# Patient Record
Sex: Female | Born: 1975 | Race: Black or African American | Hispanic: No | Marital: Single | Smoking: Never smoker
Health system: Southern US, Community
[De-identification: ages and names within clinical notes are randomized; demographics above are authoritative.]

## PROBLEM LIST (undated history)

## (undated) DIAGNOSIS — R202 Paresthesia of skin: Principal | ICD-10-CM

## (undated) DIAGNOSIS — I10 Essential (primary) hypertension: Secondary | ICD-10-CM

## (undated) DIAGNOSIS — I82412 Acute embolism and thrombosis of left femoral vein: Secondary | ICD-10-CM

## (undated) DIAGNOSIS — D492 Neoplasm of unspecified behavior of bone, soft tissue, and skin: Secondary | ICD-10-CM

## (undated) DIAGNOSIS — R200 Anesthesia of skin: Secondary | ICD-10-CM

## (undated) DIAGNOSIS — N946 Dysmenorrhea, unspecified: Secondary | ICD-10-CM

## (undated) HISTORY — DX: Anesthesia of skin: R20.0

## (undated) HISTORY — DX: Acute embolism and thrombosis of left femoral vein: I82.412

## (undated) HISTORY — DX: Dysmenorrhea, unspecified: N94.6

## (undated) HISTORY — PX: MOUTH SURGERY: SHX715

## (undated) HISTORY — DX: Paresthesia of skin: R20.2

## (undated) HISTORY — PX: OTHER SURGICAL HISTORY: SHX169

## (undated) HISTORY — DX: Neoplasm of unspecified behavior of bone, soft tissue, and skin: D49.2

## (undated) HISTORY — DX: Essential (primary) hypertension: I10

---

## 1999-07-08 DIAGNOSIS — T8859XA Other complications of anesthesia, initial encounter: Secondary | ICD-10-CM

## 1999-07-08 HISTORY — DX: Other complications of anesthesia, initial encounter: T88.59XA

## 2002-05-08 ENCOUNTER — Emergency Department (HOSPITAL_COMMUNITY): Admission: EM | Admit: 2002-05-08 | Discharge: 2002-05-08 | Payer: Self-pay | Admitting: Emergency Medicine

## 2002-06-09 ENCOUNTER — Emergency Department (HOSPITAL_COMMUNITY): Admission: EM | Admit: 2002-06-09 | Discharge: 2002-06-09 | Payer: Self-pay

## 2003-08-24 ENCOUNTER — Other Ambulatory Visit: Admission: RE | Admit: 2003-08-24 | Discharge: 2003-08-24 | Payer: Self-pay | Admitting: Internal Medicine

## 2004-06-06 ENCOUNTER — Emergency Department (HOSPITAL_COMMUNITY): Admission: EM | Admit: 2004-06-06 | Discharge: 2004-06-06 | Payer: Self-pay | Admitting: Emergency Medicine

## 2004-09-09 ENCOUNTER — Emergency Department (HOSPITAL_COMMUNITY): Admission: EM | Admit: 2004-09-09 | Discharge: 2004-09-09 | Payer: Self-pay | Admitting: Emergency Medicine

## 2004-11-07 ENCOUNTER — Other Ambulatory Visit: Admission: RE | Admit: 2004-11-07 | Discharge: 2004-11-07 | Payer: Self-pay | Admitting: Internal Medicine

## 2005-11-13 ENCOUNTER — Other Ambulatory Visit: Admission: RE | Admit: 2005-11-13 | Discharge: 2005-11-13 | Payer: Self-pay | Admitting: Internal Medicine

## 2006-11-16 ENCOUNTER — Other Ambulatory Visit: Admission: RE | Admit: 2006-11-16 | Discharge: 2006-11-16 | Payer: Self-pay | Admitting: *Deleted

## 2007-11-23 ENCOUNTER — Other Ambulatory Visit: Admission: RE | Admit: 2007-11-23 | Discharge: 2007-11-23 | Payer: Self-pay | Admitting: Family Medicine

## 2008-11-29 ENCOUNTER — Other Ambulatory Visit: Admission: RE | Admit: 2008-11-29 | Discharge: 2008-11-29 | Payer: Self-pay | Admitting: Family Medicine

## 2009-08-22 ENCOUNTER — Emergency Department (HOSPITAL_COMMUNITY): Admission: EM | Admit: 2009-08-22 | Discharge: 2009-08-22 | Payer: Self-pay | Admitting: Emergency Medicine

## 2009-12-21 ENCOUNTER — Other Ambulatory Visit: Admission: RE | Admit: 2009-12-21 | Discharge: 2009-12-21 | Payer: Self-pay | Admitting: Family Medicine

## 2010-09-25 LAB — POCT CARDIAC MARKERS: CKMB, poc: <1 ng/mL — ABNORMAL LOW (ref 1.0–8.0)

## 2010-09-25 LAB — DIFFERENTIAL
Basophils Absolute: 0.1 10*3/uL (ref 0.0–0.1)
Eosinophils Absolute: 0.1 10*3/uL (ref 0.0–0.7)
Lymphocytes Relative: 49 % — ABNORMAL HIGH (ref 12–46)
Lymphs Abs: 2.3 10*3/uL (ref 0.7–4.0)
Monocytes Absolute: 0.5 10*3/uL (ref 0.1–1.0)
Monocytes Relative: 11 % (ref 3–12)
Neutro Abs: 1.7 10*3/uL (ref 1.7–7.7)

## 2010-09-25 LAB — COMPREHENSIVE METABOLIC PANEL
BUN: 14 mg/dL (ref 6–23)
Chloride: 110 mEq/L (ref 96–112)
Creatinine, Ser: 0.84 mg/dL (ref 0.4–1.2)
GFR calc Af Amer: >60 mL/min (ref >60)
Glucose, Bld: 117 mg/dL — ABNORMAL HIGH (ref 70–99)
Sodium: 141 mEq/L (ref 135–145)
Total Protein: 6.7 g/dL (ref 6.0–8.3)

## 2010-09-25 LAB — CBC
Hemoglobin: 11.6 g/dL — ABNORMAL LOW (ref 12.0–15.0)
Platelets: 167 10*3/uL (ref 150–400)
RBC: 3.88 MIL/uL (ref 3.87–5.11)

## 2010-09-25 LAB — APTT: aPTT: 23 seconds — ABNORMAL LOW (ref 24–37)

## 2010-12-24 ENCOUNTER — Other Ambulatory Visit: Payer: Self-pay | Admitting: Family Medicine

## 2010-12-24 ENCOUNTER — Other Ambulatory Visit (HOSPITAL_COMMUNITY)
Admission: RE | Admit: 2010-12-24 | Discharge: 2010-12-24 | Disposition: A | Discharge status: Home / Self Care | Payer: BC Managed Care – PPO | Source: Ambulatory Visit | Attending: Family Medicine | Admitting: Family Medicine

## 2010-12-24 DIAGNOSIS — R87810 Cervical high risk human papillomavirus (HPV) DNA test positive: Secondary | ICD-10-CM | POA: Insufficient documentation

## 2010-12-24 DIAGNOSIS — Z124 Encounter for screening for malignant neoplasm of cervix: Secondary | ICD-10-CM | POA: Insufficient documentation

## 2011-02-06 ENCOUNTER — Encounter (INDEPENDENT_AMBULATORY_CARE_PROVIDER_SITE_OTHER): Payer: Self-pay | Admitting: General Surgery

## 2011-12-26 ENCOUNTER — Other Ambulatory Visit: Payer: Self-pay | Admitting: Family Medicine

## 2011-12-26 ENCOUNTER — Other Ambulatory Visit (HOSPITAL_COMMUNITY)
Admission: RE | Admit: 2011-12-26 | Discharge: 2011-12-26 | Disposition: A | Discharge status: Home / Self Care | Payer: BC Managed Care – PPO | Source: Ambulatory Visit | Attending: Family Medicine | Admitting: Family Medicine

## 2011-12-26 DIAGNOSIS — Z124 Encounter for screening for malignant neoplasm of cervix: Secondary | ICD-10-CM | POA: Insufficient documentation

## 2011-12-26 DIAGNOSIS — N760 Acute vaginitis: Secondary | ICD-10-CM | POA: Insufficient documentation

## 2011-12-26 DIAGNOSIS — Z1151 Encounter for screening for human papillomavirus (HPV): Secondary | ICD-10-CM | POA: Insufficient documentation

## 2012-01-12 ENCOUNTER — Other Ambulatory Visit: Payer: Self-pay | Admitting: Family Medicine

## 2012-07-16 ENCOUNTER — Other Ambulatory Visit: Payer: Self-pay | Admitting: Family Medicine

## 2012-07-16 ENCOUNTER — Other Ambulatory Visit (HOSPITAL_COMMUNITY)
Admission: RE | Admit: 2012-07-16 | Discharge: 2012-07-16 | Disposition: A | Discharge status: Home / Self Care | Payer: BC Managed Care – PPO | Source: Ambulatory Visit | Attending: Family Medicine | Admitting: Family Medicine

## 2012-07-16 DIAGNOSIS — Z01419 Encounter for gynecological examination (general) (routine) without abnormal findings: Secondary | ICD-10-CM | POA: Insufficient documentation

## 2012-07-16 DIAGNOSIS — Z1151 Encounter for screening for human papillomavirus (HPV): Secondary | ICD-10-CM | POA: Insufficient documentation

## 2012-12-29 ENCOUNTER — Other Ambulatory Visit (HOSPITAL_COMMUNITY)
Admission: RE | Admit: 2012-12-29 | Discharge: 2012-12-29 | Disposition: A | Discharge status: Home / Self Care | Payer: BC Managed Care – PPO | Source: Ambulatory Visit | Attending: Family Medicine | Admitting: Family Medicine

## 2012-12-29 ENCOUNTER — Other Ambulatory Visit: Payer: Self-pay | Admitting: Family Medicine

## 2012-12-29 DIAGNOSIS — Z124 Encounter for screening for malignant neoplasm of cervix: Secondary | ICD-10-CM | POA: Insufficient documentation

## 2012-12-29 DIAGNOSIS — Z1151 Encounter for screening for human papillomavirus (HPV): Secondary | ICD-10-CM | POA: Insufficient documentation

## 2013-08-02 ENCOUNTER — Encounter: Payer: Self-pay | Admitting: Neurology

## 2013-08-02 ENCOUNTER — Ambulatory Visit (INDEPENDENT_AMBULATORY_CARE_PROVIDER_SITE_OTHER): Payer: BC Managed Care – PPO | Admitting: Neurology

## 2013-08-02 ENCOUNTER — Encounter (INDEPENDENT_AMBULATORY_CARE_PROVIDER_SITE_OTHER): Payer: Self-pay

## 2013-08-02 ENCOUNTER — Telehealth: Payer: Self-pay | Admitting: Neurology

## 2013-08-02 VITALS — BP 98/55 | HR 64 | Ht 67.0 in | Wt 156.0 lb

## 2013-08-02 DIAGNOSIS — N946 Dysmenorrhea, unspecified: Secondary | ICD-10-CM | POA: Insufficient documentation

## 2013-08-02 DIAGNOSIS — R202 Paresthesia of skin: Principal | ICD-10-CM | POA: Insufficient documentation

## 2013-08-02 DIAGNOSIS — R200 Anesthesia of skin: Secondary | ICD-10-CM | POA: Insufficient documentation

## 2013-08-02 DIAGNOSIS — R209 Unspecified disturbances of skin sensation: Secondary | ICD-10-CM

## 2013-08-02 NOTE — Telephone Encounter (Signed)
  I have called her, dicussed the need for MRI cervical scan.

## 2013-08-02 NOTE — Progress Notes (Signed)
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 Referred by her primary care physician Dr. Orland Penman, for evaluation of right hand numbness. Last visit with me was 2011.  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  she presenting with acute onset of left hand numbness involving dorsum left second and first fingers, weakness of left arm and hand on Feb 16th 2011.  She woke up from sleep about 2:00 in the morning,noticed numbness involving her left hand,she also noticed clumsiness of her left  hand, has difficulty extending her left wrist and finger.  she actually went to the emergency room. MRI brain was normal. There is no evidence of acute stroke.  But her symptoms persistent  she complained persistent left hand numbness, tingling involving dorsum left first and second fingers, weakness mostly left wrist extension, and finger extension, she also has weakness such as typing, holding books.  She had gradual improvement over few weeks.    she denies involvement of her left face, left trunk, left leg. there is no similar episode in the past.    she has been on contraceptive treatment for prolonged time, no othher vascular risk factors.   EMG nerve conduction study of left upper extremity today is normal, there is no evidence of left upper extremity neuropathy, such as radial neuropathy,or left cervical radiculopathy.  She has been doing very well on to a December 20 third 2014, she had the habit of sleeping with her arm underneath her head, she woke up began to notice right hand numbness, she has to continue to shake her hand is to about one hour for right hand numbness to go away, but she continued to have persistent right thumb and index finger numbness, mainly involving the back part of her fingers, and hands, no significant  weakness this time,  She denies neck pain, no gait difficulty. She also complains sometimes she woke up from sleep, has difficulty extending her arms, suggestive of triceps weakness    Her mother at [place]  underwent lumbar decompression surgery, prior to the surgery she had left foot drop for 2 years, surgery failed to help her symptoms there was no family history of similar disease,   REVIEW OF SYSTEMS: Full 14 system review of systems performed and notable only for numbness, weakness   ALLERGIES: Allergies  Allergen Reactions  . Codeine     HOME MEDICATIONS: No outpatient prescriptions prior to visit.   No facility-administered medications prior to visit.    PAST MEDICAL HISTORY: Past Medical History  Diagnosis Date  . Numbness and tingling in right hand   . Dysmenorrhea     PAST SURGICAL HISTORY: Past Surgical History  Procedure Laterality Date  . None      FAMILY HISTORY: History reviewed. No pertinent family history.  SOCIAL HISTORY:  History   Social History  . Marital Status: Single    Spouse Name: N/A    Number of Children: N/A  . Years of Education: College   Occupational History  .      School Teacher   Social History Main Topics  . Smoking status: Never Smoker   . Smokeless tobacco: Never Used  . Alcohol Use: No  . Drug Use: No  . Sexual Activity: Not on file   Other Topics Concern  . Not on file   Social History Narrative  Patient is a Education officer, museum full time.   EducationNurse, mental health.   Right handed.              PHYSICAL EXAM   Filed Vitals:   08/02/13 1509  BP: 98/55  Pulse: 64  Height: _0  (1.702 m)  Weight: 156 lb (70.761 kg)    Not recorded    Body mass index is 24.43 kg/(m^2).   Generalized: In no acute distress  Neck: Supple, no carotid bruits   Cardiac: Regular rate rhythm  Pulmonary: Clear to auscultation bilaterally  Musculoskeletal: No deformity  Neurological examination  Mentation: Alert  oriented to time, place, history taking, and causual conversation  Cranial nerve II-XII: Pupils were equal round reactive to light extraocular movements were full, Visual field were full on confrontational test. Bilateral fundi were sharp.  Facial sensation and strength were normal. Hearing was intact to finger rubbing bilaterally. Uvula tongue midline.  head turning and shoulder shrug and were normal and symmetric.Tongue protrusion into cheek strength was normal.  Motor: normal tone, bulk and strength.  Sensory: Intact to fine touch, pinprick, preserved vibratory sensation, and proprioception at toes, with exception of decreased light touch, and hypersensitivity at right dorsum thumb index finger, in the distribution of left superficial radial nerve.  Coordination: Normal finger to nose, heel-to-shin bilaterally there was no truncal ataxia  Gait: Rising up from seated position without assistance, normal stance, without trunk ataxia, moderate stride, good arm swing, smooth turning, able to perform tiptoe, and heel walking without difficulty.   Romberg signs: Negative  Deep tendon reflexes: Brachioradialis 2/2, biceps 2/2, triceps 2/2, patellar 2/2, Achilles 2/2, plantar responses were flexor bilaterally.   DIAGNOSTIC DATA (LABS, IMAGING, TESTING) - I reviewed patient records, labs, notes, testing and imaging myself where available.  Lab Results  Component Value Date   WBC 4.6 08/22/2009   HGB 11.6* 08/22/2009   HCT 33.8* 08/22/2009   MCV 87.3 08/22/2009   PLT 167 08/22/2009      Component Value Date/Time   NA 141 08/22/2009 0454   K 4.4 08/22/2009 0454   CL 110 08/22/2009 0454   CO2 25 08/22/2009 0454   GLUCOSE 117* 08/22/2009 0454   BUN 14 08/22/2009 0454   CREATININE 0.84 08/22/2009 0454   CALCIUM 8.8 08/22/2009 0454   PROT 6.7 08/22/2009 0454   ALBUMIN 3.4* 08/22/2009 0454   AST 18 08/22/2009 0454   ALT 13 08/22/2009 0454   ALKPHOS 40 08/22/2009 0454   BILITOT 0.5 08/22/2009 0454    GFRNONAA >60 08/22/2009 0454   GFRAA  Value: >60        The eGFR has been calculated using the MDRD equation. This calculation has not been validated in all clinical situations. eGFR's persistently <60 mL/min signify possible Chronic Kidney Disease. 08/22/2009 0454   ASSESSMENT AND PLAN   38 year old female,with acute onset of right hand numbness in the distribution of right superficial radial nerve, previously similar presentation involving left radial nerve   1 differentiation diagnosis including hereditary neuropathy with liability to pressure  2 Will repeat EMG nerve conduction study 3 MRI of cervical spine 4 [facility] genetic testing.   Marcial Pacas, M.D. Ph.D.  [facility] [address] [place], [place] [address] [phone]

## 2013-08-03 NOTE — Progress Notes (Signed)
Quick Note:  Please call patient, elevated TSH indicating elevated thyroid functional test, will fax the result to her primary care, she needs repeat thyroid functional panel in 3-4 months, ______

## 2013-08-04 LAB — LYME, TOTAL AB TEST/REFLEX: Lyme IgG/IgM Ab: <0.91 {ISR} (ref 0.00–0.90)

## 2013-08-04 LAB — COMPREHENSIVE METABOLIC PANEL
ALK PHOS: 34 IU/L — AB (ref 39–117)
ALT: 16 IU/L (ref 0–32)
AST: 19 IU/L (ref 0–40)
Albumin/Globulin Ratio: 1.7 (ref 1.1–2.5)
Albumin: 3.8 g/dL (ref 3.5–5.5)
BUN/Creatinine Ratio: 21 — ABNORMAL HIGH (ref 8–20)
BUN: 17 mg/dL (ref 6–20)
CHLORIDE: 105 mmol/L (ref 97–108)
CO2: 23 mmol/L (ref 18–29)
CREATININE: 0.82 mg/dL (ref 0.57–1.00)
Calcium: 8.7 mg/dL (ref 8.7–10.2)
GFR, EST AFRICAN AMERICAN: 104 mL/min/{1.73_m2} (ref >59)
GFR, EST NON AFRICAN AMERICAN: 90 mL/min/{1.73_m2} (ref >59)
Globulin, Total: 2.2 g/dL (ref 1.5–4.5)
Glucose: 82 mg/dL (ref 65–99)
POTASSIUM: 4.2 mmol/L (ref 3.5–5.2)
SODIUM: 140 mmol/L (ref 134–144)
Total Bilirubin: 0.2 mg/dL (ref 0.0–1.2)
Total Protein: 6 g/dL (ref 6.0–8.5)

## 2013-08-04 LAB — HIV ANTIBODY (ROUTINE TESTING W REFLEX)
HIV 1/HIV 2 AB: NONREACTIVE
HIV 1/O/2 Abs-Index Value: <1 (ref <1.00)

## 2013-08-04 LAB — IFE AND PE, SERUM
Albumin SerPl Elph-Mcnc: 3.3 g/dL (ref 3.2–5.6)
Albumin/Glob SerPl: 1.3 (ref 0.7–2.0)
Alpha 1: 0.2 g/dL (ref 0.1–0.4)
Alpha2 Glob SerPl Elph-Mcnc: 0.6 g/dL (ref 0.4–1.2)
B-GLOBULIN SERPL ELPH-MCNC: 0.8 g/dL (ref 0.6–1.3)
Gamma Glob SerPl Elph-Mcnc: 1.1 g/dL (ref 0.5–1.6)
Globulin, Total: 2.7 g/dL (ref 2.0–4.5)
IGA/IMMUNOGLOBULIN A, SERUM: 229 mg/dL (ref 91–414)
IGG (IMMUNOGLOBIN G), SERUM: 1188 mg/dL (ref 700–1600)
IGM (IMMUNOGLOBULIN M), SRM: 90 mg/dL (ref 40–230)

## 2013-08-04 LAB — CBC WITH DIFFERENTIAL
BASOS: 0 %
Basophils Absolute: 0 10*3/uL (ref 0.0–0.2)
EOS ABS: 0 10*3/uL (ref 0.0–0.4)
Eos: 1 %
HEMATOCRIT: 35.1 % (ref 34.0–46.6)
Hemoglobin: 11.4 g/dL (ref 11.1–15.9)
IMMATURE GRANULOCYTES: 0 %
Immature Grans (Abs): 0 10*3/uL (ref 0.0–0.1)
LYMPHS ABS: 2.1 10*3/uL (ref 0.7–3.1)
LYMPHS: 40 %
MCH: 28.2 pg (ref 26.6–33.0)
MCHC: 32.5 g/dL (ref 31.5–35.7)
MCV: 87 fL (ref 79–97)
MONOCYTES: 11 %
MONOS ABS: 0.6 10*3/uL (ref 0.1–0.9)
NEUTROS ABS: 2.4 10*3/uL (ref 1.4–7.0)
NEUTROS PCT: 48 %
Platelets: 227 10*3/uL (ref 150–379)
RBC: 4.04 x10E6/uL (ref 3.77–5.28)
RDW: 14.1 % (ref 12.3–15.4)
WBC: 5.1 10*3/uL (ref 3.4–10.8)

## 2013-08-04 LAB — CK: CK TOTAL: 102 U/L (ref 24–173)

## 2013-08-04 LAB — THYROID PANEL WITH TSH
FREE THYROXINE INDEX: 2.2 (ref 1.2–4.9)
T3 UPTAKE RATIO: 29 % (ref 24–39)
T4 TOTAL: 7.6 ug/dL (ref 4.5–12.0)
TSH: 0.377 u[IU]/mL — ABNORMAL LOW (ref 0.450–4.500)

## 2013-08-04 LAB — VITAMIN B12: Vitamin B-12: 625 pg/mL (ref 211–946)

## 2013-08-04 LAB — C-REACTIVE PROTEIN: CRP: 0.7 mg/L (ref 0.0–4.9)

## 2013-08-04 LAB — FOLATE: Folate: >19.9 ng/mL (ref >3.0)

## 2013-08-04 LAB — SEDIMENTATION RATE: SED RATE: 3 mm/h (ref 0–32)

## 2013-08-04 LAB — RPR: RPR: NONREACTIVE

## 2013-08-05 ENCOUNTER — Telehealth: Payer: Self-pay | Admitting: *Deleted

## 2013-08-05 NOTE — Telephone Encounter (Signed)
I called pt and LMVM for her to return call about [facility] [facility] testing that Dr. Krista Blue ordered.

## 2013-08-08 ENCOUNTER — Ambulatory Visit (INDEPENDENT_AMBULATORY_CARE_PROVIDER_SITE_OTHER): Payer: BC Managed Care – PPO | Admitting: Neurology

## 2013-08-08 ENCOUNTER — Encounter (INDEPENDENT_AMBULATORY_CARE_PROVIDER_SITE_OTHER): Payer: Self-pay

## 2013-08-08 DIAGNOSIS — Z0289 Encounter for other administrative examinations: Secondary | ICD-10-CM

## 2013-08-08 DIAGNOSIS — R202 Paresthesia of skin: Principal | ICD-10-CM

## 2013-08-08 DIAGNOSIS — G5630 Lesion of radial nerve, unspecified upper limb: Secondary | ICD-10-CM

## 2013-08-08 DIAGNOSIS — R200 Anesthesia of skin: Secondary | ICD-10-CM

## 2013-08-08 DIAGNOSIS — R209 Unspecified disturbances of skin sensation: Secondary | ICD-10-CM

## 2013-08-08 NOTE — Procedures (Signed)
   NCS (NERVE CONDUCTION STUDY) WITH EMG (ELECTROMYOGRAPHY) REPORT   STUDY DATE: February second 2015 PATIENT NAME: Kristine Kirby DOB: 08/19/1975 MRN: [id number]    TECHNOLOGIST: Laretta Alstrom ELECTROMYOGRAPHER: Marcial Pacas M.D.  CLINICAL INFORMATION:   38 years old [place] American female, with persistent right dorsum hand numbness after she slept on it, there was no significant weakness  On examination: Bilateral hand wrist flexion extension finger flexion extension motor strength is normal. deep tendon reflexes were normal and symmetric  FINDINGS: NERVE CONDUCTION STUDY: Right median, ulnar, and radial sensory responses were normal. Right median, ulnar, and radial motor responses were normal  NEEDLE ELECTROMYOGRAPHY:  Selected needle examination was performed at right upper extremity muscles, right cervical paraspinal muscles  Needle examination of right pronator teres, first also interossei, triceps, biceps, deltoid, brachioradialis was normal  There was no spontaneous activity at right cervical paraspinal muscles, right C5, 6, 7  IMPRESSION:   This is a normal study. There is no electrodiagnostic evidence of right upper extremity neuropathy, or right cervical radiculopathy   INTERPRETING PHYSICIAN:   Marcial Pacas M.D. Ph.D. [facility] [address] [place], [place] [address] [phone]

## 2013-08-12 NOTE — Progress Notes (Signed)
Quick Note:  Left message with elevated TSH, per Dr. Krista Blue. Relayed we will forward result to PCP, and to call with further questions. ______

## 2013-08-15 NOTE — Telephone Encounter (Signed)
I called pt and asked her to call me back about the specialty labs requested by Dr.Yan via VM.    This was my second call.   Pt will call back if interested in this.

## 2013-08-29 ENCOUNTER — Ambulatory Visit
Admission: RE | Admit: 2013-08-29 | Discharge: 2013-08-29 | Disposition: A | Discharge status: Home / Self Care | Payer: BC Managed Care – PPO | Source: Ambulatory Visit | Attending: Neurology | Admitting: Neurology

## 2013-08-29 DIAGNOSIS — R200 Anesthesia of skin: Secondary | ICD-10-CM

## 2013-08-29 DIAGNOSIS — R202 Paresthesia of skin: Principal | ICD-10-CM

## 2013-08-29 DIAGNOSIS — R209 Unspecified disturbances of skin sensation: Secondary | ICD-10-CM

## 2013-09-02 NOTE — Progress Notes (Signed)
Quick Note:  Shared normal MR Cervical spine thru VM message ______

## 2013-09-08 ENCOUNTER — Telehealth: Payer: Self-pay | Admitting: *Deleted

## 2013-09-08 NOTE — Telephone Encounter (Signed)
Message copied by Oliver Hum on Thu Sep 08, 2013  3:43 PM ------      Message from: Richmond Campbell      Created: Thu Sep 08, 2013  3:15 PM       Patient called and wanted to let you know that she has gotten all the messages you have left.  Did not ask for a returned call.  ------

## 2013-09-08 NOTE — Telephone Encounter (Signed)
Noted  

## 2013-11-10 ENCOUNTER — Ambulatory Visit: Payer: BC Managed Care – PPO | Admitting: Neurology

## 2013-12-26 ENCOUNTER — Ambulatory Visit: Payer: BC Managed Care – PPO | Admitting: Neurology

## 2013-12-26 ENCOUNTER — Ambulatory Visit: Payer: Self-pay | Admitting: Neurology

## 2013-12-30 ENCOUNTER — Other Ambulatory Visit (HOSPITAL_COMMUNITY)
Admission: RE | Admit: 2013-12-30 | Discharge: 2013-12-30 | Disposition: A | Discharge status: Home / Self Care | Payer: BC Managed Care – PPO | Source: Ambulatory Visit | Attending: Family Medicine | Admitting: Family Medicine

## 2013-12-30 ENCOUNTER — Other Ambulatory Visit: Payer: Self-pay | Admitting: Family Medicine

## 2013-12-30 DIAGNOSIS — Z1151 Encounter for screening for human papillomavirus (HPV): Secondary | ICD-10-CM | POA: Insufficient documentation

## 2013-12-30 DIAGNOSIS — Z124 Encounter for screening for malignant neoplasm of cervix: Secondary | ICD-10-CM | POA: Insufficient documentation

## 2014-01-02 LAB — CYTOLOGY - PAP

## 2015-06-02 ENCOUNTER — Emergency Department (EMERGENCY_DEPARTMENT_HOSPITAL)
Admit: 2015-06-02 | Discharge: 2015-06-02 | Disposition: A | Discharge status: Home / Self Care | Payer: BC Managed Care – PPO | Attending: Emergency Medicine | Admitting: Emergency Medicine

## 2015-06-02 ENCOUNTER — Encounter (HOSPITAL_COMMUNITY): Payer: Self-pay | Admitting: Emergency Medicine

## 2015-06-02 ENCOUNTER — Emergency Department (HOSPITAL_COMMUNITY)
Admission: EM | Admit: 2015-06-02 | Discharge: 2015-06-02 | Disposition: A | Discharge status: Home / Self Care | Payer: BC Managed Care – PPO | Attending: Emergency Medicine | Admitting: Emergency Medicine

## 2015-06-02 DIAGNOSIS — R2243 Localized swelling, mass and lump, lower limb, bilateral: Secondary | ICD-10-CM | POA: Diagnosis present

## 2015-06-02 DIAGNOSIS — M7989 Other specified soft tissue disorders: Secondary | ICD-10-CM | POA: Diagnosis not present

## 2015-06-02 DIAGNOSIS — Z8742 Personal history of other diseases of the female genital tract: Secondary | ICD-10-CM | POA: Insufficient documentation

## 2015-06-02 DIAGNOSIS — M545 Low back pain, unspecified: Secondary | ICD-10-CM

## 2015-06-02 DIAGNOSIS — Z793 Long term (current) use of hormonal contraceptives: Secondary | ICD-10-CM | POA: Insufficient documentation

## 2015-06-02 DIAGNOSIS — Z3202 Encounter for pregnancy test, result negative: Secondary | ICD-10-CM | POA: Diagnosis not present

## 2015-06-02 DIAGNOSIS — R600 Localized edema: Secondary | ICD-10-CM | POA: Insufficient documentation

## 2015-06-02 LAB — CBC WITH DIFFERENTIAL/PLATELET
BASOS PCT: 0 %
Basophils Absolute: 0 10*3/uL (ref 0.0–0.1)
Eosinophils Absolute: 0.1 10*3/uL (ref 0.0–0.7)
Eosinophils Relative: 2 %
HEMATOCRIT: 34.7 % — AB (ref 36.0–46.0)
HEMOGLOBIN: 11.4 g/dL — AB (ref 12.0–15.0)
Lymphocytes Relative: 50 %
Lymphs Abs: 2.4 10*3/uL (ref 0.7–4.0)
MCH: 28.6 pg (ref 26.0–34.0)
MCHC: 32.9 g/dL (ref 30.0–36.0)
MCV: 87.2 fL (ref 78.0–100.0)
MONOS PCT: 9 %
Monocytes Absolute: 0.4 10*3/uL (ref 0.1–1.0)
NEUTROS ABS: 1.9 10*3/uL (ref 1.7–7.7)
NEUTROS PCT: 39 %
Platelets: 182 10*3/uL (ref 150–400)
RBC: 3.98 MIL/uL (ref 3.87–5.11)
RDW: 13.9 % (ref 11.5–15.5)
WBC: 4.9 10*3/uL (ref 4.0–10.5)

## 2015-06-02 LAB — URINALYSIS, ROUTINE W REFLEX MICROSCOPIC
Bilirubin Urine: NEGATIVE
Glucose, UA: NEGATIVE mg/dL
HGB URINE DIPSTICK: NEGATIVE
Ketones, ur: NEGATIVE mg/dL
LEUKOCYTES UA: NEGATIVE
NITRITE: NEGATIVE
Protein, ur: NEGATIVE mg/dL
SPECIFIC GRAVITY, URINE: 1.012 (ref 1.005–1.030)
pH: 7.5 (ref 5.0–8.0)

## 2015-06-02 LAB — PREGNANCY, URINE: PREG TEST UR: NEGATIVE

## 2015-06-02 LAB — COMPREHENSIVE METABOLIC PANEL
ALT: 17 U/L (ref 14–54)
ANION GAP: 8 (ref 5–15)
AST: 18 U/L (ref 15–41)
Albumin: 3.4 g/dL — ABNORMAL LOW (ref 3.5–5.0)
Alkaline Phosphatase: 42 U/L (ref 38–126)
BUN: 12 mg/dL (ref 6–20)
CALCIUM: 8.7 mg/dL — AB (ref 8.9–10.3)
CHLORIDE: 106 mmol/L (ref 101–111)
CO2: 24 mmol/L (ref 22–32)
Creatinine, Ser: 0.83 mg/dL (ref 0.44–1.00)
GFR calc non Af Amer: >60 mL/min (ref >60)
Glucose, Bld: 97 mg/dL (ref 65–99)
Potassium: 4.7 mmol/L (ref 3.5–5.1)
SODIUM: 138 mmol/L (ref 135–145)
Total Bilirubin: 0.4 mg/dL (ref 0.3–1.2)
Total Protein: 6.5 g/dL (ref 6.5–8.1)

## 2015-06-02 LAB — BRAIN NATRIURETIC PEPTIDE: B Natriuretic Peptide: 172.1 pg/mL — ABNORMAL HIGH (ref 0.0–100.0)

## 2015-06-02 NOTE — Progress Notes (Signed)
VASCULAR LAB PRELIMINARY  PRELIMINARY  PRELIMINARY  PRELIMINARY  Bilateral lower extremity venous duplex completed.    Preliminary report:  There is no DVT or SVT noted in the bilateral lower extremities.   Caitriona Sundquist, RVT 06/02/2015, 1:30 PM

## 2015-06-02 NOTE — ED Provider Notes (Signed)
CSN: [id number]     Arrival date & time 06/02/15  0915 History   First MD Initiated Contact with Patient 06/02/15 0957     Chief Complaint  Patient presents with  . Leg Swelling    bilateral, left > right     (Consider location/radiation/quality/duration/timing/severity/associated sxs/prior Treatment) HPI Comments: 39 year old female who presents with leg swelling. Patient states that yesterday she noticed bilateral leg swelling which is worse in her left leg. She noticed some warmth of her left leg and tightness. No significant pain. No recent trauma. She also notes some low back pain across her lower back. Pain is worse with certain movements. No saddle anesthesia, extremity weakness, or numbness. No urinary problems. No abdominal pain, vomiting, or fevers. No history of blood clots or cancer. No recent travel. Patient is on OCPs.  The history is provided by the patient.    Past Medical History  Diagnosis Date  . Numbness and tingling in right hand   . Dysmenorrhea    Past Surgical History  Procedure Laterality Date  . None     No family history on file. Social History  Substance Use Topics  . Smoking status: Never Smoker   . Smokeless tobacco: Never Used  . Alcohol Use: No     Comment: occ   OB History    No data available     Review of Systems 10 Systems reviewed and are negative for acute change except as noted in the HPI.    Allergies  Codeine  Home Medications   Prior to Admission medications   Medication Sig Start Date End Date Taking? Authorizing Provider  VELIVET 0.1/0.125/0.15 -0.025 MG tablet Take 1 tablet by mouth every morning.  06/03/13  Yes Historical Provider, MD   BP 166/97 mmHg  Pulse 66  Temp(Src) 98 F (36.7 C) (Oral)  Resp 18  SpO2 100%  LMP 05/19/2015 (Approximate) Physical Exam  Constitutional: She is oriented to person, place, and time. She appears well-developed and well-nourished. No distress.  HENT:  Head: Normocephalic and  atraumatic.  Moist mucous membranes  Eyes: Conjunctivae are normal. Pupils are equal, round, and reactive to light.  Neck: Neck supple.  Cardiovascular: Normal rate, regular rhythm and normal heart sounds.   No murmur heard. Pulmonary/Chest: Effort normal and breath sounds normal.  Abdominal: Soft. Bowel sounds are normal. She exhibits no distension. There is no tenderness.  Musculoskeletal:  2+ pitting edema LLE, 1+ pitting edema RLE; 2+ pedal pulses; 5/5 strength and normal sensation b/l LE; no midline spinal tenderness.  Neurological: She is alert and oriented to person, place, and time.  Fluent speech  Skin: Skin is warm and dry. No rash noted. No erythema.  Psychiatric: She has a normal mood and affect. Judgment normal.  Nursing note and vitals reviewed.   ED Course  Procedures (including critical care time) Labs Review Labs Reviewed  COMPREHENSIVE METABOLIC PANEL - Abnormal; Notable for the following:    Calcium 8.7 (*)    Albumin 3.4 (*)    All other components within normal limits  CBC WITH DIFFERENTIAL/PLATELET - Abnormal; Notable for the following:    Hemoglobin 11.4 (*)    HCT 34.7 (*)    All other components within normal limits  BRAIN NATRIURETIC PEPTIDE - Abnormal; Notable for the following:    B Natriuretic Peptide 172.1 (*)    All other components within normal limits  URINALYSIS, ROUTINE W REFLEX MICROSCOPIC (NOT AT [facility])  PREGNANCY, URINE    Imaging Review No  results found. I have personally reviewed and evaluated these lab results as part of my medical decision-making.   EKG Interpretation None      MDM   Final diagnoses:  None  b/l LE edema Low back strain   Pt p/w 1 day of LE swelling L>R as well as low back pain across lower back. On exam, patient well-appearing. Vital signs notable for hypertension. She had pitting edema of both legs left greater than right. Normal strength and sensation. No neurologic complaints to suggest cauda equina.  Obtained above labs to evaluate edema and also obtained bilateral lower extremity Dopplers as patient is on OCPs.  Ultrasound was negative for DVT. Labwork unremarkable and shows normal WBC count, negative UA, and no evidence of heart failure, liver disease, or kidney disease to explain the patient's symptoms. Discussed reassuring workup with patient as well as supportive care instructions including compression stockings, elevation of legs, and avoidance of salt. Regarding back pain, Patient demonstrates no lower extremity weakness, saddle anesthesia, bowel or bladder incontinence, or any other neurologic deficits concerning for cauda equina. No fevers or other infectious symptoms to suggest by the patient's back pain is due to an infection. I have reviewed return precautions, including the development of any of these signs or symptoms, and the patient has voiced understanding. I reviewed supportive care instructions, including NSAIDs, early range of motion exercises, and PCP follow-up if symptoms do not improve for referral to physical therapy. Patient voiced understanding and was discharged in satisfactory condition.   Sharlett Iles, MD 06/02/15 [phone]

## 2015-06-02 NOTE — ED Notes (Signed)
Sent tests to lab

## 2015-06-02 NOTE — ED Notes (Signed)
Pt informed we need urine sample

## 2015-06-02 NOTE — ED Notes (Addendum)
Patient states her legs are swollen, left >right after standing up all day thursday. Patient also c/o back pain that started last night. Patient denies any similar problems of this nature. 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 Patient denies symptom improvement with elevation.

## 2015-10-01 ENCOUNTER — Emergency Department (HOSPITAL_COMMUNITY)
Admission: EM | Admit: 2015-10-01 | Discharge: 2015-10-01 | Disposition: A | Discharge status: Home / Self Care | Payer: BC Managed Care – PPO | Attending: Emergency Medicine | Admitting: Emergency Medicine

## 2015-10-01 ENCOUNTER — Encounter (HOSPITAL_COMMUNITY): Payer: Self-pay | Admitting: Emergency Medicine

## 2015-10-01 DIAGNOSIS — R110 Nausea: Secondary | ICD-10-CM | POA: Insufficient documentation

## 2015-10-01 DIAGNOSIS — Z8742 Personal history of other diseases of the female genital tract: Secondary | ICD-10-CM | POA: Diagnosis not present

## 2015-10-01 DIAGNOSIS — R61 Generalized hyperhidrosis: Secondary | ICD-10-CM | POA: Insufficient documentation

## 2015-10-01 DIAGNOSIS — Z79899 Other long term (current) drug therapy: Secondary | ICD-10-CM | POA: Diagnosis not present

## 2015-10-01 DIAGNOSIS — J020 Streptococcal pharyngitis: Secondary | ICD-10-CM | POA: Insufficient documentation

## 2015-10-01 DIAGNOSIS — J029 Acute pharyngitis, unspecified: Secondary | ICD-10-CM | POA: Diagnosis present

## 2015-10-01 LAB — RAPID STREP SCREEN (MED CTR MEBANE ONLY): Streptococcus, Group A Screen (Direct): POSITIVE — AB

## 2015-10-01 LAB — POC URINE PREG, ED: PREG TEST UR: NEGATIVE

## 2015-10-01 MED ORDER — IBUPROFEN 800 MG PO TABS: 800.0000 mg | ORAL_TABLET | Freq: Three times a day (TID) | ORAL | Status: DC | PRN

## 2015-10-01 MED ORDER — PENICILLIN G BENZATHINE 1200000 UNIT/2ML IM SUSP
1.2000 10*6.[IU] | Freq: Once | INTRAMUSCULAR | Status: AC
  Administered 2015-10-01: 1.2 10*6.[IU] via INTRAMUSCULAR
  Filled 2015-10-01: qty 2

## 2015-10-01 MED ORDER — CHLORHEXIDINE GLUCONATE 0.12 % MT SOLN: 15.0000 mL | Freq: Two times a day (BID) | OROMUCOSAL | Status: DC

## 2015-10-01 NOTE — ED Provider Notes (Signed)
CSN: [id number]     Arrival date & time 10/01/15  0108 History   First MD Initiated Contact with Patient 10/01/15 0222     Chief Complaint  Patient presents with  . Sore Throat     (Consider location/radiation/quality/duration/timing/severity/associated sxs/prior Treatment) HPI   40 year old female presenting with complaints of sore throat. For the past 2 days she has had body aches, chills, sweating, sore throat, nausea, and decrease in appetite. 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 She is a Pharmacist, hospital.  She is sleeping mostly, and complaining of myalgias. Furthermore, patient mentioned that she has noticed some mild vaginal spotting. She is usually regular and her anticipate menstrual period isn't into 5 days later. She denies having fever, chest pain, shortness of breath, productive cough, dysuria or vaginal discharge.  Past Medical History  Diagnosis Date  . Numbness and tingling in right hand   . Dysmenorrhea    Past Surgical History  Procedure Laterality Date  . None     No family history on file. Social History  Substance Use Topics  . Smoking status: Never Smoker   . Smokeless tobacco: Never Used  . Alcohol Use: No     Comment: occ   OB History    No data available     Review of Systems  All other systems reviewed and are negative.     Allergies  Codeine  Home Medications   Prior to Admission medications   Medication Sig Start Date End Date Taking? Authorizing Provider  VELIVET 0.1/0.125/0.15 -0.025 MG tablet Take 1 tablet by mouth every morning.  06/03/13   Historical Provider, MD   BP 131/52 mmHg  Pulse 72  Temp(Src) 98.8 F (37.1 C) (Oral)  Resp 18  SpO2 97%  LMP 10/01/2015 Physical Exam  Constitutional: She is oriented to person, place, and time. She appears well-developed and well-nourished. No distress.  African-American female nontoxic in appearance  HENT:  Head: Atraumatic.   Ears: TMs normal bilaterally Nose: Normal nares Throat: Uvula is midline, posterior oropharyngeal erythema without tonsillar enlargement or exudates, no trismus and no signs of deep tissue infection  Eyes: Conjunctivae are normal.  Neck: Normal range of motion. Neck supple.  No nuchal rigidity  Cardiovascular: Normal rate and regular rhythm.   Pulmonary/Chest: Effort normal and breath sounds normal.  Abdominal: Soft.  No splenomegaly  Lymphadenopathy:    She has cervical adenopathy.  Neurological: She is alert and oriented to person, place, and time.  Skin: No rash noted.  Psychiatric: She has a normal mood and affect.  Nursing note and vitals reviewed.   ED Course  Procedures (including critical care time) Labs Review Labs Reviewed  RAPID STREP SCREEN (NOT AT [facility]) - Abnormal; Notable for the following:    Streptococcus, Group A Screen (Direct) POSITIVE (*)    All other components within normal limits  POC URINE PREG, ED    Imaging Review No results found. I have personally reviewed and evaluated these images and lab results as part of my medical decision-making.   EKG Interpretation None      MDM   Final diagnoses:  Strep pharyngitis    BP 131/52 mmHg  Pulse 72  Temp(Src) 98.8 F (37.1 C) (Oral)  Resp 18  SpO2 97%  LMP 10/01/2015   Patient presents with complaint of sore throat. She does not have any evidence of peritonsillar abscess however her strep test is positive. She is  afebrile with stable normal vital sign and nontoxic in appearance. She cannot tolerate codeine. She did complain of some vaginal spotting without any abdominal pain. Strep test ordered. Antibiotic including penicillin given via IM in the ED.  3:00 AM Pregnancy test is negative. Patient has received antibiotic for strep throat infection. We'll discharge patient with symptomatic treatment, ENT referral given as needed. Return precaution discussed.  Domenic Moras, PA-C 10/01/15  0308  Orpah Greek, MD 10/01/15 [phone]

## 2015-10-01 NOTE — ED Notes (Signed)
Bed: WA20 Expected date:  Expected time:  Means of arrival:  Comments: 

## 2015-10-01 NOTE — ED Notes (Signed)
Pt presents c/o sore throat, chills, sweating, nausea, poor appetite, and fatigue since Saturday 3/25. Recent exposure to flu and norovirus per pt report. Also reports abnormal vaginal spotting today. Pain rated 5/10, worst in throat.

## 2015-10-01 NOTE — ED Notes (Signed)
Patient called to treatment room, no answer.

## 2015-10-01 NOTE — Discharge Instructions (Signed)

## 2016-01-17 ENCOUNTER — Other Ambulatory Visit: Payer: Self-pay | Admitting: Family Medicine

## 2016-01-17 ENCOUNTER — Other Ambulatory Visit (HOSPITAL_COMMUNITY)
Admission: RE | Admit: 2016-01-17 | Discharge: 2016-01-17 | Disposition: A | Discharge status: Home / Self Care | Payer: BC Managed Care – PPO | Source: Ambulatory Visit | Attending: Family Medicine | Admitting: Family Medicine

## 2016-01-17 DIAGNOSIS — Z124 Encounter for screening for malignant neoplasm of cervix: Secondary | ICD-10-CM | POA: Diagnosis present

## 2016-01-17 DIAGNOSIS — Z113 Encounter for screening for infections with a predominantly sexual mode of transmission: Secondary | ICD-10-CM | POA: Insufficient documentation

## 2016-01-17 DIAGNOSIS — Z1151 Encounter for screening for human papillomavirus (HPV): Secondary | ICD-10-CM | POA: Insufficient documentation

## 2016-01-18 LAB — CYTOLOGY - PAP

## 2018-02-02 ENCOUNTER — Other Ambulatory Visit (HOSPITAL_COMMUNITY)
Admission: RE | Admit: 2018-02-02 | Discharge: 2018-02-02 | Disposition: A | Discharge status: Home / Self Care | Payer: BC Managed Care – PPO | Source: Ambulatory Visit | Attending: Family Medicine | Admitting: Family Medicine

## 2018-02-02 ENCOUNTER — Other Ambulatory Visit: Payer: Self-pay | Admitting: Family Medicine

## 2018-02-02 DIAGNOSIS — Z124 Encounter for screening for malignant neoplasm of cervix: Secondary | ICD-10-CM | POA: Insufficient documentation

## 2018-02-05 LAB — CYTOLOGY - PAP: DIAGNOSIS: NEGATIVE

## 2018-04-09 ENCOUNTER — Encounter: Payer: Self-pay | Admitting: Hematology and Oncology

## 2018-04-09 ENCOUNTER — Telehealth: Payer: Self-pay | Admitting: Hematology and Oncology

## 2018-04-09 NOTE — Telephone Encounter (Signed)
New referral received from Dr. Drema Dallas for abnormal cbc. Pt has been scheduled to see Dr. Audelia Hives on 10/18 at 9am. Pt aware to arrive 30 minutes early. Letter mailed to the pt.

## 2018-04-22 ENCOUNTER — Other Ambulatory Visit: Payer: Self-pay | Admitting: Hematology and Oncology

## 2018-04-22 DIAGNOSIS — D649 Anemia, unspecified: Secondary | ICD-10-CM

## 2018-04-22 NOTE — Progress Notes (Unsigned)
[facility] Initial Consultation  Patient Name:  Kristine Kirby  DOB: 07-12-1975   Date of Service: April 23, 2018  Referring Provider: Leighton Ruff  Consulting Physician: Henreitta Leber, MD Hematology/Oncology  Patient Care Team: Patient Care Team: Gavin Pound, MD as PCP - General (Family Medicine)  Current Outpatient Medications on File Prior to Visit  Medication Sig  . chlorhexidine (PERIDEX) 0.12 % solution Use as directed 15 mLs in the mouth or throat 2 (two) times daily.  Marland Kitchen ibuprofen (ADVIL,MOTRIN) 800 MG tablet Take 1 tablet (800 mg total) by mouth every 8 (eight) hours as needed for fever or moderate pain.  Marland Kitchen VELIVET 0.1/0.125/0.15 -0.025 MG tablet Take 1 tablet by mouth every morning.    No current facility-administered medications on file prior to visit.      Reason for Referral: In the setting of anemia over an extended but indefinite period of time, she presents now for further diagnostic and therapeutic recommendations.  History Present Illness: Kristine Kirby is a 42 year old resident of [place] whose past medical history is significant for dyslipidemia;   Past Medical History:  Diagnosis Date  . Dysmenorrhea   . Numbness and tingling in right hand     Surgical History: Past Surgical History:  Procedure Laterality Date  . None     Gynecologic History:   Family History: No family history on file.  Social History: Social History   Socioeconomic History  . Marital status: Divorced    Spouse name: Not on file  . Number of children: Not on file  . Years of education: College  . Highest education level: Not on file  Occupational History    Comment: School Teacher  Social Needs  . Financial resource strain: Not on file  . Food insecurity:    Worry: Not on file    Inability: Not on file  . Transportation needs:    Medical: Not on file    Non-medical: Not on file  Tobacco Use  .  Smoking status: Never Smoker  . Smokeless tobacco: Never Used  Substance and Sexual Activity  . Alcohol use: No    Comment: occ  . Drug use: No  . Sexual activity: Not on file  Lifestyle  . Physical activity:    Days per week: Not on file    Minutes per session: Not on file  . Stress: Not on file  Relationships  . Social connections:    Talks on phone: Not on file    Gets together: Not on file    Attends religious service: Not on file    Active member of club or organization: Not on file    Attends meetings of clubs or organizations: Not on file    Relationship status: Not on file  . Intimate partner violence:    Fear of current or ex partner: Not on file    Emotionally abused: Not on file    Physically abused: Not on file    Forced sexual activity: Not on file  Other Topics Concern  . Not on file  Social History Narrative   Patient is a Education officer, museum full time.   EducationNurse, mental health.   Right handed.            Transfusion History: No prior transfusion  Exposure History: She has no known exposure to toxic chemicals, radiation, or pesticides.  Allergies  Allergen Reactions  . Codeine Nausea And Vomiting    Severe nausea and vomiting  Current Medications: Current Outpatient Medications on File Prior to Visit  Medication Sig  . chlorhexidine (PERIDEX) 0.12 % solution Use as directed 15 mLs in the mouth or throat 2 (two) times daily.  Marland Kitchen ibuprofen (ADVIL,MOTRIN) 800 MG tablet Take 1 tablet (800 mg total) by mouth every 8 (eight) hours as needed for fever or moderate pain.  Marland Kitchen VELIVET 0.1/0.125/0.15 -0.025 MG tablet Take 1 tablet by mouth every morning.    No current facility-administered medications on file prior to visit.      Review of Systems: Constitutional: No fever, sweats, or shaking chills.  No appetite or weight deficit. Skin: No rash, scaling, sores, lumps, or jaundice. HEENT: No visual changes or hearing deficit. Pulmonary: No unusual cough, sore  throat, or orthopnea. Breasts: No complaints. Cardiovascular: No coronary artery disease, angina, or myocardial infarction.  No cardiac dysrhythmia, essential hypertension, or dyslipidemia. Gastrointestinal: No indigestion, dysphagia, abdominal pain, diarrhea, or constipation.  No change in bowel habits. Genitourinary: No frequency, urgency, hematuria, or dysuria. Musculoskeletal: No arthralgias or myalgias; no joint swelling, pain, or instability. Hematologic: No bleeding tendency or easy bruisability. Endocrine: No intolerance to heat or cold; no thyroid disease or diabetes mellitus. Vascular: No peripheral arterial or venous thromboembolic disease. Psychological: No anxiety, depression, or mood changes; no mental health illnesses. Neurological: No dizziness, lightheadedness, syncope, or near syncopal episodes; no numbness or tingling in the fingers or toes.  Physical Examination: Vital Signs: There is no height or weight on file to calculate BSA. There were no vitals filed for this visit. There were no vitals filed for this visit. ECOG PERFORMANCE STATUS: {CHL ONC ECOG QQ:[id number]} Constitutional:  Kristine Kirby is fully nourished and developed.  He/She looks age appropriate.  He/She is friendly and cooperative without respiratory compromise at rest. Skin: No rashes, scaling, dryness, jaundice, or itching. HEENT: Head is normocephalic and atraumatic.  Pupils are equal round and reactive to light and accommodation.  Sclerae are anicteric.  Conjunctivae are pink.  No sinus tenderness nor oropharyngeal lesions.  Lips without cracking or peeling; tongue without mass, inflammation, or nodularity.  Mucous membranes are moist. Neck: Supple and symmetric.  No jugular venous distention or thyromegaly.  Trachea is midline. Lymphatics: No cervical or supraclavicular lymphadenopathy.  No epitrochlear, axillary, or inguinal lymphadenopathy is appreciated. Breasts: No mass, discharge, dimpling, or  retraction. Respiratory/chest: Thorax is symmetrical.  Breath sounds are clear to auscultation and percussion.  Normal excursion and respiratory effort. Back: Symmetric without deformity or tenderness. Cardiovascular: Heart rate and rhythm are regular without murmurs, gallops, or rubs. Gastrointestinal: Abdomen is soft, nontender; no organomegaly.  Bowel sounds are normoactive.  No masses are appreciated. Genitourinary: Normal external female/female genitalia. Rectal examination: Not performed. Extremities: In the lower extremities, there is no asymmetric swelling, erythema, tenderness, or cord formation.  No clubbing, cyanosis, nor edema. Hematologic: No petechiae, hematomas, or ecchymoses. Psychological:  He/She is oriented to person, place, and time; normal affect, memory, and cognition. Neurological: There are no gross neurologic deficits.  Laboratory Results: I have reviewed the data as listed: CBC Latest Ref Rng & Units 06/02/2015 08/02/2013 08/22/2009  WBC 4.0 - 10.5 K/uL 4.9 5.1 4.6  Hemoglobin 12.0 - 15.0 g/dL 11.4(L) 11.4 11.6(L)  Hematocrit 36.0 - 46.0 % 34.7(L) 35.1 33.8(L)  Platelets 150 - 400 K/uL 182 227 167    CMP Latest Ref Rng & Units 06/02/2015 08/02/2013 08/22/2009  Glucose 65 - 99 mg/dL 97 82 117(H)  BUN 6 - 20 mg/dL 12 17 14   Creatinine 0.44 -  1.00 mg/dL 0.83 0.82 0.84  Sodium 135 - 145 mmol/L 138 140 141  Potassium 3.5 - 5.1 mmol/L 4.7 4.2 4.4  Chloride 101 - 111 mmol/L 106 105 110  CO2 22 - 32 mmol/L 24 23 25   Calcium 8.9 - 10.3 mg/dL 8.7(L) 8.7 8.8  Total Protein 6.5 - 8.1 g/dL 6.5 6.0 6.7  Total Bilirubin 0.3 - 1.2 mg/dL 0.4 0.2 0.5  Alkaline Phos 38 - 126 U/L 42 34(L) 40  AST 15 - 41 U/L 18 19 18   ALT 14 - 54 U/L 17 16 13      Diagnostic/Imaging Studies:   Summary/Assessment:   Recommendation/Plan:   The total time spent discussing the XXX, methodology for evaluating XXX,  preliminary considerations with recommendations and plan was 60 minutes.  At  least 50% of that time was spent in discussion, reviewing outside records, laboratory evaluation, counseling, and answering questions. All questions were answered to her satisfaction. she knows to call the Clinic with any problems, questions, or concerns.  This note was dictated using voice activated technology/software.  Unfortunately, typographical errors are not uncommon, and transcription is subject to mistakes and regrettably misinterpretation.  If necessary, clarification of the above information can be discussed with me at any time.  Thank you Dr. Lannette Donath for allowing my participation in the care of [facility]. I will keep you closely informed as the results of her preliminary laboratory data become available.  Please do not hesitate to call should any questions arise regarding this initial consultation and discussion.  FOLLOW UP: AS DIRECTED   cc:   Henreitta Leber, MD  Hematology/Oncology [address] [place], [place] [address] Office: [phone] NIOE: [phone]

## 2018-04-23 ENCOUNTER — Encounter: Payer: Self-pay | Admitting: Hematology and Oncology

## 2018-04-23 ENCOUNTER — Telehealth: Payer: Self-pay | Admitting: Hematology and Oncology

## 2018-04-23 ENCOUNTER — Inpatient Hospital Stay: Payer: BC Managed Care – PPO

## 2018-04-23 ENCOUNTER — Inpatient Hospital Stay: Payer: BC Managed Care – PPO | Attending: Hematology and Oncology | Admitting: Hematology and Oncology

## 2018-04-23 VITALS — BP 143/67 | HR 60 | Temp 98.4°F | Resp 17 | Ht 67.0 in | Wt 155.9 lb

## 2018-04-23 DIAGNOSIS — D649 Anemia, unspecified: Secondary | ICD-10-CM

## 2018-04-23 LAB — CBC WITH DIFFERENTIAL (CANCER CENTER ONLY)
ABS IMMATURE GRANULOCYTES: 0.01 10*3/uL (ref 0.00–0.07)
BASOS ABS: 0 10*3/uL (ref 0.0–0.1)
BASOS PCT: 0 %
Eosinophils Absolute: 0 10*3/uL (ref 0.0–0.5)
Eosinophils Relative: 1 %
HCT: 37.5 % (ref 36.0–46.0)
Hemoglobin: 12 g/dL (ref 12.0–15.0)
IMMATURE GRANULOCYTES: 0 %
LYMPHS ABS: 1.7 10*3/uL (ref 0.7–4.0)
Lymphocytes Relative: 37 %
MCH: 28.9 pg (ref 26.0–34.0)
MCHC: 32 g/dL (ref 30.0–36.0)
MCV: 90.4 fL (ref 80.0–100.0)
Monocytes Absolute: 0.4 10*3/uL (ref 0.1–1.0)
Monocytes Relative: 8 %
NEUTROS ABS: 2.5 10*3/uL (ref 1.7–7.7)
NEUTROS PCT: 54 %
NRBC: 0 % (ref 0.0–0.2)
PLATELETS: 228 10*3/uL (ref 150–400)
RBC: 4.15 MIL/uL (ref 3.87–5.11)
RDW: 13.3 % (ref 11.5–15.5)
WBC Count: 4.6 10*3/uL (ref 4.0–10.5)

## 2018-04-23 LAB — RETIC PANEL
IMMATURE RETIC FRACT: 10.8 % (ref 2.3–15.9)
RBC.: 4.15 MIL/uL (ref 3.87–5.11)
RETIC COUNT ABSOLUTE: 61 10*3/uL (ref 19.0–186.0)
Retic Ct Pct: 1.5 % (ref 0.4–3.1)
Reticulocyte Hemoglobin: 34 pg (ref >27.9)

## 2018-04-23 LAB — COMPREHENSIVE METABOLIC PANEL
ALBUMIN: 3.8 g/dL (ref 3.5–5.0)
ALT: 10 U/L (ref 0–44)
ANION GAP: 8 (ref 5–15)
AST: 14 U/L — ABNORMAL LOW (ref 15–41)
Alkaline Phosphatase: 50 U/L (ref 38–126)
BUN: 12 mg/dL (ref 6–20)
CO2: 25 mmol/L (ref 22–32)
Calcium: 9.1 mg/dL (ref 8.9–10.3)
Chloride: 106 mmol/L (ref 98–111)
Creatinine, Ser: 0.9 mg/dL (ref 0.44–1.00)
GFR calc non Af Amer: >60 mL/min (ref >60)
GLUCOSE: 88 mg/dL (ref 70–99)
POTASSIUM: 4.2 mmol/L (ref 3.5–5.1)
SODIUM: 139 mmol/L (ref 135–145)
Total Bilirubin: 0.8 mg/dL (ref 0.3–1.2)
Total Protein: 7.5 g/dL (ref 6.5–8.1)

## 2018-04-23 LAB — IRON AND TIBC
IRON: 153 ug/dL — AB (ref 41–142)
SATURATION RATIOS: 37 % (ref 21–57)
TIBC: 415 ug/dL (ref 236–444)
UIBC: 262 ug/dL

## 2018-04-23 LAB — FOLATE: FOLATE: 13 ng/mL (ref >5.9)

## 2018-04-23 LAB — FERRITIN: Ferritin: 88 ng/mL (ref 11–307)

## 2018-04-23 LAB — TSH: TSH: 1.717 u[IU]/mL (ref 0.308–3.960)

## 2018-04-23 LAB — LACTATE DEHYDROGENASE: LDH: 147 U/L (ref 98–192)

## 2018-04-23 LAB — VITAMIN B12: Vitamin B-12: 318 pg/mL (ref 180–914)

## 2018-04-23 NOTE — Progress Notes (Signed)
[facility] Initial Consultation  Patient Name:  Kristine Kirby  DOB: September 17, 1975   Date of Service: April 23, 2018  Referring Provider: Leighton Ruff, MD  Consulting Physician: Henreitta Leber, MD Hematology/Oncology  Reason for Referral: In the setting of mild anemia without obvious etiology, she presents now for further diagnostic and therapeutic recommendations.  History Present Illness: Kristine Kirby is a 42 year old resident of [place] whose past medical history is relatively insignificant with the exception of hypercholesterolemia and vitamin D deficiency, currently on replacement therapy.  Her primary care physician is Dr. Leighton Ruff.  She is alone at this first visit.  She is a third grade elementary school teacher.  I reviewed her previous laboratory studies dating back nearly 7 years that suggest mild, but chronic anemia of unclear etiology.  Her hemoglobin has been 11-12 g/dL.  She has had no additional cytopenias.  She is divorced and nulliparous.  She has been on oral contraception since the age of 21 years.  She continues to menstruate on a monthly basis with 4 days of "moderate" flow.  When she was younger, her menses were much heavier.  Her last menstruation was 2 weeks earlier.  She has no diabetes mellitus, primary hypertension, or coronary artery disease.  She reports no cardiac dysrhythmia.  She has no seizure disorder or stroke syndrome.  She reports no thyroid disease.  She denies gastroesophageal reflux or peptic ulcer disease.  She has no viral hepatitis, inflammatory bowel disease, or symptomatic diverticulosis.  She reports no kidney or liver abnormalities.  She has no rheumatoid or gouty arthritis.  Until recently she was unaware of her mild anemia without leukopenia or thrombocytopenia.  She has no peripheral arterial venous or thromboembolic disease. Ledia's overall energy level is fairly stable.  She  sleeps without difficulty 5-6 hours nightly.  For several years she has not eaten chicken, red meat, or milk.  She does consume seafood, cheese, and eggs.    There is no history in her immediate family of any blood disorders or bleeding tendencies.  At times she gets lightheaded, but feels better after eating.  Both her appetite and weight remain stable.  She has no visual changes or hearing deficit.  She has no unusual headache, syncope, near syncopal episodes.  She denies rash or itching.  She has no abnormal vaginal bleeding or discharge.  She admits to being cold "all the time" when others are comfortable.  No cough, sore throat, or orthopnea are appreciated.  She has no pain or difficulty in swallowing.  No fever, shaking chills, sweats, or flulike symptoms are evident.  She has no heartburn or indigestion.  She denies nausea, vomiting, diarrhea, or constipation.  She reports no melena or bright red blood per rectum.  She has no urinary frequency, urgency, hematuria, or dysuria.  There are no focal areas of bone, joint, muscle pain.  She denies any bleeding tendency or easy bruisability.  There is no numbness or tingling in the fingers or toes.  It is with this background she presents now for further diagnostic and therapeutic recommendations in the setting of mild anemia as outlined above.  Past Medical History:  Diagnosis Date  . Dysmenorrhea   . Numbness and tingling in right hand    Past Surgical History:  Procedure Laterality Date  . None     Gynecologic History: Her menarche was at age 42 years. She is nulliparous. Her last menses was 2 weeks earlier. She normally has  4 days of moderate flow. Since the age of 30 years, she has been on oral contraception. Her last screening mammogram was in August. Her last Pap smear was this year.  Family History: Mother: Age 15 years: DJD Father: Estranged Sister (1): Age 15 years without major medical problems. She has no brothers.  Social  History   Socioeconomic History  . Marital status: Divorced    Spouse name: Not on file  . Number of children: Not on file  . Years of education: College  . Highest education level: Not on file  Occupational History    Comment: School Teacher  Social Needs  . Financial resource strain: Not on file  . Food insecurity:    Worry: Not on file    Inability: Not on file  . Transportation needs:    Medical: Not on file    Non-medical: Not on file  Tobacco Use  . Smoking status: Never Smoker  . Smokeless tobacco: Never Used  Substance and Sexual Activity  . Alcohol use: No    Comment: occ  . Drug use: No  . Sexual activity: Not on file  Lifestyle  . Physical activity:    Days per week: Not on file    Minutes per session: Not on file  . Stress: Not on file  Relationships  . Social connections:    Talks on phone: Not on file    Gets together: Not on file    Attends religious service: Not on file    Active member of club or organization: Not on file    Attends meetings of clubs or organizations: Not on file    Relationship status: Not on file  . Intimate partner violence:    Fear of current or ex partner: Not on file    Emotionally abused: Not on file    Physically abused: Not on file    Forced sexual activity: Not on file  Other Topics Concern  . Not on file  Social History Narrative   Patient is a Education officer, museum full time.   EducationNurse, mental health.   Right handed.           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 She is divorced. She is a lifetime non-smoker. Her alcohol intake is social, never heavy. She reports no recreational drug use.  Transfusion History: No prior transfusion  Exposure History: She has no known exposure to toxic chemicals, radiation, or pesticides.  Allergies  Allergen Reactions  . Codeine Nausea And Vomiting    Severe nausea and vomiting  No food or seasonal allergies  Current Outpatient Medications on File Prior to Visit   Medication Sig  . ibuprofen (ADVIL,MOTRIN) 800 MG tablet Take 1 tablet (800 mg total) by mouth every 8 (eight) hours as needed for fever or moderate pain.  Marland Kitchen VELIVET 0.1/0.125/0.15 -0.025 MG tablet Take 1 tablet by mouth every morning.   . Vitamin D, Ergocalciferol, (DRISDOL) 50000 units CAPS capsule Take 2,000 Units by mouth every 7 (seven) days.  . chlorhexidine (PERIDEX) 0.12 % solution Use as directed 15 mLs in the mouth or throat 2 (two) times daily. (Patient not taking: Reported on 04/23/2018)   No current facility-administered medications on file prior to visit.     Review of Systems: Constitutional: No fever, sweats, or shaking chills.  No appetite or weight deficit; energy level stable. Skin: No rash, scaling, sores, lumps, or jaundice. HEENT: No visual changes or hearing deficit; no sinusitis or rhinitis. Pulmonary: No unusual cough,  sore throat, or orthopnea.  No DOE. Breasts: No complaints. Cardiovascular: No coronary artery disease, angina, or myocardial infarction.  No cardiac dysrhythmia, essential hypertension; dyslipidemia. Gastrointestinal: No indigestion, dysphagia, abdominal pain, diarrhea, or constipation.  No change in bowel habits.  No nausea or vomiting.  No melena or bright red blood per rectum. Genitourinary: No urinary frequency, urgency, hematuria, or dysuria. Musculoskeletal: No arthralgias or myalgias; no joint swelling, pain, or instability. Hematologic: No bleeding tendency or easy bruisability. Endocrine: She is "always cold."  No intolerance to heat; no thyroid disease or diabetes mellitus. Vascular: No peripheral arterial or venous thromboembolic disease. Psychological: No anxiety, depression, or mood changes; no mental health illnesses. Neurological: No dizziness, lightheadedness, syncope, or near syncopal episodes; no numbness or tingling in the fingers or toes.  Physical Examination: Vital Signs: Body surface area is 1.83 meters squared.  Vitals:    04/23/18 0859  BP: (!) 143/67  Pulse: 60  Resp: 17  Temp: 98.4 F (36.9 C)  SpO2: 96%    Filed Weights   04/23/18 0859  Weight: 155 lb 14.4 oz (70.7 kg)  ECOG PERFORMANCE STATUS: 0 Constitutional:  Emilie Carp is a fully nourished and developed African-American.  She looks age appropriate. She is friendly and cooperative without respiratory compromise at rest. Skin: No rashes, scaling, dryness, jaundice, or itching. HEENT: Head is normocephalic and atraumatic.  Pupils are equal round and reactive to light and accommodation.  Sclerae are anicteric.  Conjunctivae are pink.  No sinus tenderness nor oropharyngeal lesions.  Lips without cracking or peeling; tongue without mass, inflammation, or nodularity.  Mucous membranes are moist. Neck: Supple and symmetric.  No jugular venous distention or thyromegaly.  Trachea is midline. Lymphatics: No cervical or supraclavicular lymphadenopathy.  No epitrochlear, axillary, or inguinal lymphadenopathy is appreciated. Breasts: No mass, discharge, dimpling, or retraction. Respiratory/chest: Thorax is symmetrical.  Breath sounds are clear to auscultation and percussion.  Normal excursion and respiratory effort. Back: Symmetric without deformity or tenderness. Cardiovascular: Heart rate and rhythm are regular without murmurs or gallops. Gastrointestinal: Abdomen is soft, nontender; no organomegaly.  Bowel sounds are normoactive.  No masses are appreciated. Extremities: In the lower extremities, there is no asymmetric swelling, erythema, tenderness, or cord formation.  No clubbing, cyanosis, nor edema. Hematologic: No petechiae, hematomas, or ecchymoses. Psychological:  She is oriented to person, place, and time; normal affect, memory, and cognition. Neurological: There are no gross neurologic deficits.  Laboratory Results: I have reviewed the data as listed:  Ref Range & Units 10:18 (04/23/18) 21yrago (06/02/15) 425yrgo (08/02/13) 8y60yro (08/22/09)  29yr52yr (08/22/09)  WBC Count 4.0 - 10.5 K/uL 4.6  4.9  5.1 R  4.6   RBC 3.87 - 5.11 MIL/uL 4.15  3.98  4.04 R  3.88   Hemoglobin 12.0 - 15.0 g/dL 12.0  11.4Low   11.4 R  11.6Low    HCT 36.0 - 46.0 % 37.5  34.7Low   35.1 R  33.8Low    MCV 80.0 - 100.0 fL 90.4  87.2 R 87 R  87.3 R  MCH 26.0 - 34.0 pg 28.9  28.6  28.2 R    MCHC 30.0 - 36.0 g/dL 32.0  32.9  32.5 R  34.2   RDW 11.5 - 15.5 % 13.3  13.9  14.1 R  13.3   Platelet Count 150 - 400 K/uL 228  182  227 R  167   nRBC 0.0 - 0.2 % 0.0       Neutrophils  Relative % % 54  39  48  37Low  R   Neutro Abs 1.7 - 7.7 K/uL 2.5  1.9  2.4 R 1.7    Lymphocytes Relative % 37  50   49High  R   Lymphs Abs 0.7 - 4.0 K/uL 1.7  2.4  2.1 R 2.3    Monocytes Relative % _0 R   Monocytes Absolute 0.1 - 1.0 K/uL 0.4  0.4   0.5    Eosinophils Relative % _1 R   Eosinophils Absolute 0.0 - 0.5 K/uL 0.0  0.1 R 0.0 R 0.1 R   Basophils Relative % 0  0   1 R   Basophils Absolute 0.0 - 0.1 K/uL 0.0  0.0  0.0 R 0.1    Immature Granulocytes % 0   0     Abs Immature Granulocytes 0.00 - 0.07 K/uL 0.01         Ref Range & Units 10:18 46yrago 439yrgo 8y74yro  Sodium 135 - 145 mmol/L 139  138  140 R 141 R  Potassium 3.5 - 5.1 mmol/L 4.2  4.7  4.2 R 4.4 R  Chloride 98 - 111 mmol/L 106  106 R 105 R 110 R  CO2 22 - 32 mmol/L _2 R 25 R  Glucose, Bld 70 - 99 mg/dL 88  97 R 82 R 117High    BUN 6 - 20 mg/dL _3 R  Creatinine, Ser 0.44 - 1.00 mg/dL 0.90  0.83  0.82 R 0.84 R  Calcium 8.9 - 10.3 mg/dL 9.1  8.7Low   8.7 R, CM 8.8 R  Total Protein 6.5 - 8.1 g/dL 7.5  6.5  6.0 R 6.7 R  Albumin 3.5 - 5.0 g/dL 3.8  3.4Low   3.8 R 3.4Low  R  AST 15 - 41 U/L 14Low   18  19 R 18 R  ALT 0 - 44 U/L 10  17 R 16 R 13 R  Alkaline Phosphatase 38 - 126 U/L 50  42  34Low  R 40 R  Total Bilirubin 0.3 - 1.2 mg/dL 0.8  0.4  0.2 R 0.5   GFR calc non Af Amer >60 mL/min >60  >60  90 R >60   GFR calc Af Amer >60 mL/min >60  >60 CM 104 R >60     The eGFR has been  calculated   Summary/Assessment: In the setting of mild anemia without obvious etiology, she presents now for further diagnostic and therapeutic recommendations.  I reviewed her previous laboratory studies dating back nearly 7 years that suggest mild, but chronic anemia of unclear etiology.  Her hemoglobin has been 11-12 g/dL.  She has had no additional cytopenias.  She is divorced and nulliparous.  She has been on oral contraception since the age of [age]ars.  She continues to menstruate on a monthly basis with 4 days of "moderate" flow.  When she was younger, her menses were much heavier.  Her last menstruation was 2 weeks earlier. Luccia's overall energy level is fairly stable.  She sleeps without difficulty 5-6 hours nightly.  For several years she has not eaten chicken, red meat, or milk.  She does consume seafood, cheese, and eggs. There is no history in her immediate family of any blood disorders or bleeding tendencies.    She has no diabetes mellitus, primary hypertension, or coronary artery  disease.  She reports no cardiac dysrhythmia.  She has no seizure disorder or stroke syndrome.  She reports no thyroid disease.  She denies gastroesophageal reflux or peptic ulcer disease.  She has no viral hepatitis, inflammatory bowel disease, or symptomatic diverticulosis.  She reports no kidney or liver abnormalities.  She has no rheumatoid or gouty arthritis.  Until recently she was unaware of her mild anemia without leukopenia or thrombocytopenia.  She has no peripheral arterial venous or thromboembolic disease.   At times she gets lightheaded, but feels better after eating.  Both her appetite and weight remain stable.  She has no visual changes or hearing deficit.  She has no unusual headache, syncope, near syncopal episodes.  She denies rash or itching.  She has no abnormal vaginal bleeding or discharge.  She admits to being cold "all the time" when others are comfortable.  No cough, sore throat, or  orthopnea are appreciated.  She has no pain or difficulty in swallowing.  No fever, shaking chills, sweats, or flulike symptoms are evident.  She has no heartburn or indigestion.  She denies nausea, vomiting, diarrhea, or constipation.  She reports no melena or bright red blood per rectum.  She has no urinary frequency, urgency, hematuria, or dysuria.  There are no focal areas of bone, joint, muscle pain.  She denies any bleeding tendency or easy bruisability.  There is no numbness or tingling in the fingers or toes.  Her other comorbid problems include hypercholesterolemia and vitamin D deficiency, currently on replacement therapy.    Recommendation/Plan: The results of her laboratory studies from today were not available at the time of discharge.  They will be discussed in detail the time of her next visit.  We reviewed her previous laboratory studies through Dr. Drema Dallas office.  Because her anemia is mild, and she is still premenopausal and actively menstruating, laboratory studies were obtained to exclude an underlying iron deficit, nutritional deficiency, thyroid dysfunction, or hemolytic process.  She has a follow-up appointment on November 1 to discuss those results with recommendations.  She was advised to call us in the interim should any new or untoward problems arise.  The total time spent discussing her medical and gynecologic history, laboratory studies, rationale for evaluating anemia of unclear etiology, and methodology for evaluating anemia was 60 minutes.  At least 50% of that time was spent in discussion, reviewing outside records, laboratory evaluation, counseling, and answering questions. All questions were answered to her satisfaction.   This note was dictated using voice activated technology/software.  Unfortunately, typographical errors are not uncommon, and transcription is subject to mistakes and regrettably misinterpretation.  If necessary, clarification of the above  information can be discussed with me at any time.  Thank you Dr. Drema Dallas for allowing my participation in the care of [facility]. I will keep you closely informed as the results of her preliminary laboratory data become available.  Please do not hesitate to call should any questions arise regarding this initial consultation and discussion.  FOLLOW UP: AS DIRECTED   cc:        Leighton Ruff, MD   Henreitta Leber, MD  Hematology/Oncology [facility] [address] [place], [place] [address] Office: [phone] QBVQ: [phone]

## 2018-04-23 NOTE — Telephone Encounter (Signed)
Scheduled appt per 10/18 los - gave patient aVS and calender per los.   

## 2018-04-23 NOTE — Patient Instructions (Signed)
We discussed in detail the results of your laboratory studies from September and July through Dr. Drema Dallas office.  You are mildly anemic.  Laboratory studies were requested today to exclude an underlying iron deficit, nutritional deficiency, metabolic explanation, or thyroid dysfunction.  A follow-up appointment has been scheduled on November 1 to discuss those results.  Please do not hesitate to call should any questions or problems arise in the interim.  Thank you!  Ladona Ridgel, MD Hematology/Oncology [phone]

## 2018-04-24 LAB — HAPTOGLOBIN: HAPTOGLOBIN: 161 mg/dL (ref 34–200)

## 2018-05-07 ENCOUNTER — Telehealth: Payer: Self-pay | Admitting: Hematology and Oncology

## 2018-05-07 ENCOUNTER — Inpatient Hospital Stay: Payer: BC Managed Care – PPO | Attending: Hematology and Oncology | Admitting: Hematology and Oncology

## 2018-05-07 ENCOUNTER — Encounter: Payer: Self-pay | Admitting: Hematology and Oncology

## 2018-05-07 VITALS — BP 152/81 | HR 57 | Temp 97.8°F | Resp 17 | Ht 67.0 in | Wt 150.3 lb

## 2018-05-07 DIAGNOSIS — D649 Anemia, unspecified: Secondary | ICD-10-CM

## 2018-05-07 NOTE — Telephone Encounter (Signed)
Unable to schedule appointment for Dr Audelia Hives due to [place] for 2020 per 11/1 los

## 2018-05-07 NOTE — Patient Instructions (Signed)
We discussed in detail the results of your previous laboratory studies.  You are no longer anemic.  Your iron studies, vitamin Y03, and folic acid were within normal range.  A follow-up visit has been scheduled in 6 months.  Prior to that visit, a complete blood count has been scheduled.  Please do not hesitate to call should any new or untoward problems arise.  All the best! Ladona Ridgel, MD Hematology/Oncology

## 2018-05-07 NOTE — Progress Notes (Signed)
[facility]  Outpatient Progress Note  Patient Name:  Kristine Kirby  DOB: 1976-05-24   Date of Service: May 07, 2018  Referring Provider: Leighton Ruff, MD  Consulting Physician: Henreitta Leber, MD Hematology/Oncology  Subjective:  "I am here to follow-up on the results of my anemia evaluation with recommendations."  Objective: Review of Systems: Constitutional: No fever, sweats, or shaking chills.  No appetite or weight deficit; energy level stable. Skin: No rash, scaling, sores, lumps, or jaundice. HEENT: No visual changes or hearing deficit; no sinusitis or rhinitis. Pulmonary: No unusual cough, sore throat, or orthopnea.  No DOE. Breasts: No complaints. Cardiovascular: No coronary artery disease, angina, or myocardial infarction.  No cardiac dysrhythmia, essential hypertension; dyslipidemia. Gastrointestinal: No indigestion, dysphagia, abdominal pain, diarrhea, or constipation.  No change in bowel habits.  No nausea or vomiting.  No melena or bright red blood per rectum. Genitourinary: No urinary frequency, urgency, hematuria, or dysuria. Musculoskeletal: No arthralgias or myalgias; no joint swelling, pain, or instability. Hematologic: No bleeding tendency or easy bruisability. Endocrine: She is "always cold."  No intolerance to heat; no thyroid disease or diabetes mellitus. Vascular: No peripheral arterial or venous thromboembolic disease. Psychological: No anxiety, depression, or mood changes; no mental health illnesses. Neurological: No dizziness, lightheadedness, syncope, or near syncopal episodes; no numbness or tingling in the fingers or toes.  Past Medical History Reviewed        Family History Reviewed        Social History Reviewed  Physical Examination: Vital Signs: Body surface area is 1.8 meters squared. Body mass index is 23.54 kg/m.  BP 152/81   HR 57    RR 17   T 97.8    O2 Sat.  100% ECOG PERFORMANCE STATUS: 0 Constitutional:   Kristine Kirby is a fully nourished and developed African-American.  She looks age appropriate. She is friendly and cooperative without respiratory compromise at rest. Skin: No rashes, scaling, dryness, jaundice, or itching. HEENT: Head is normocephalic and atraumatic.  Pupils are equal round and reactive to light and accommodation.  Sclerae are anicteric.  Conjunctivae are pink.  No sinus tenderness nor oropharyngeal lesions.  Lips without cracking or peeling; tongue without mass, inflammation, or nodularity.  Mucous membranes are moist. Neck: Supple and symmetric.  No jugular venous distention or thyromegaly.  Trachea is midline. Lymphatics: No cervical or supraclavicular lymphadenopathy.  No epitrochlear, axillary, or inguinal lymphadenopathy is appreciated. Respiratory/chest: Thorax is symmetrical.  Breath sounds are clear to auscultation and percussion.  Normal excursion and respiratory effort. Back: Symmetric without deformity or tenderness. Cardiovascular: Heart rate and rhythm are regular without murmurs or gallops. Gastrointestinal: Abdomen is soft, nontender; no organomegaly.  Bowel sounds are normoactive.  No masses are appreciated. Extremities: In the lower extremities, there is no asymmetric swelling, erythema, tenderness, or cord formation.  No clubbing, cyanosis, nor edema. Hematologic: No petechiae, hematomas, or ecchymoses. Psychological:  She is oriented to person, place, and time; normal affect, memory, and cognition. Neurological: There are no gross neurologic deficits.  Allergies  Allergen Reactions  . Codeine Nausea And Vomiting    Severe nausea and vomiting  No food or seasonal allergies  Current Outpatient Medications on File Prior to Visit  Medication Sig  . chlorhexidine (PERIDEX) 0.12 % solution Use as directed 15 mLs in the mouth or throat 2 (two) times daily. (Patient not taking: Reported on 04/23/2018)  . ibuprofen (ADVIL,MOTRIN) 800 MG tablet Take 1 tablet (800  mg total) by mouth every  8 (eight) hours as needed for fever or moderate pain.  Marland Kitchen VELIVET 0.1/0.125/0.15 -0.025 MG tablet Take 1 tablet by mouth every morning.   . Vitamin D, Ergocalciferol, (DRISDOL) 50000 units CAPS capsule Take 2,000 Units by mouth every 7 (seven) days.   No current facility-administered medications on file prior to visit.     Laboratory Studies: April 23, 2018  Ref Range & Units 2wk ago (04/23/18) 82yr ago (06/02/15) 23yr ago (08/02/13) 7yr ago (08/22/09) 24yr ago (08/22/09)  WBC Count 4.0 - 10.5 K/uL 4.6  4.9  5.1 R  4.6   RBC 3.87 - 5.11 MIL/uL 4.15  3.98  4.04 R  3.88   Hemoglobin 12.0 - 15.0 g/dL 12.0  11.4Low   11.4 R  11.6Low    HCT 36.0 - 46.0 % 37.5  34.7Low   35.1 R  33.8Low    MCV 80.0 - 100.0 fL 90.4  87.2 R 87 R  87.3 R  MCH 26.0 - 34.0 pg 28.9  28.6  28.2 R    MCHC 30.0 - 36.0 g/dL 32.0  32.9  32.5 R  34.2   RDW 11.5 - 15.5 % 13.3  13.9  14.1 R  13.3   Platelet Count 150 - 400 K/uL 228  182  227 R  167   nRBC 0.0 - 0.2 % 0.0       Neutrophils Relative % % 54  39  48  37Low  R   Neutro Abs 1.7 - 7.7 K/uL 2.5  1.9  2.4 R 1.7    Lymphocytes Relative % 37  50   49High  R   Lymphs Abs 0.7 - 4.0 K/uL 1.7  2.4  2.1 R 2.3    Monocytes Relative % 8  9   11  R   Monocytes Absolute 0.1 - 1.0 K/uL 0.4  0.4   0.5    Eosinophils Relative % 1  2   1  R   Eosinophils Absolute 0.0 - 0.5 K/uL 0.0  0.1 R 0.0 R 0.1 R   Basophils Relative % 0  0   1 R   Basophils Absolute 0.0 - 0.1 K/uL 0.0  0.0  0.0 R 0.1    Immature Granulocytes % 0   0     Abs Immature Granulocytes 0.00 - 0.07 K/uL 0.01         Ref Range & Units 2wk ago 43yr ago 41yr ago 38yr ago  Sodium 135 - 145 mmol/L 139  138  140 R 141 R  Potassium 3.5 - 5.1 mmol/L 4.2  4.7  4.2 R 4.4 R  Chloride 98 - 111 mmol/L 106  106 R 105 R 110 R  CO2 22 - 32 mmol/L 25  24  23  R 25 R  Glucose, Bld 70 - 99 mg/dL 88  97 R 82 R 117High    BUN 6 - 20 mg/dL 12  12  17  14  R  Creatinine, Ser 0.44 - 1.00 mg/dL 0.90  0.83  0.82 R  0.84 R  Calcium 8.9 - 10.3 mg/dL 9.1  8.7Low   8.7 R, CM 8.8 R  Total Protein 6.5 - 8.1 g/dL 7.5  6.5  6.0 R 6.7 R  Albumin 3.5 - 5.0 g/dL 3.8  3.4Low   3.8 R 3.4Low  R  AST 15 - 41 U/L 14Low   18  19 R 18 R  ALT 0 - 44 U/L 10  17 R 16 R 13 R  Alkaline Phosphatase  38 - 126 U/L 50  42  34Low  R 40 R  Total Bilirubin 0.3 - 1.2 mg/dL 0.8  0.4  0.2 R 0.5   GFR calc non Af Amer >60 mL/min >60  >60  90 R >60   GFR calc Af Amer >60 mL/min >60  >60 CM 104 R     Ref Range & Units 2wk ago  Retic Ct Pct 0.4 - 3.1 % 1.5   RBC. 3.87 - 5.11 MIL/uL 4.15   Retic Count, Absolute 19.0 - 186.0 K/uL 61.0   Immature Retic Fract 2.3 - 15.9 % 10.8   Reticulocyte Hemoglobin >27.9 pg 34.0   Ferritin 88 Iron/TIBC 153/415 Iron saturation 37% Vitamin I33 825 Folic acid 05.3 Haptoglobin 161 LDH 147 TSH 1.717  Assessment/Summary: In the setting of mild anemia without obvious etiology, she presents now for the results of her initial laboratory evaluation with recommendations.  At the time of her initial visit, we reviewed her previous laboratory studies dating back nearly 7 years that suggest mild, but chronic anemia of unclear etiology.  Her hemoglobin has been 11-12 g/dL. She has had no additional cytopenias.  She is divorced and nulliparous.  She has been on oral contraception since the age of 66 years.  She continues to menstruate on a monthly basis with 4 days of "moderate" flow.  When she was younger, her menses were much heavier. Her last menstruation was 4 weeks earlier.   Zyan's overall energy level is fairly stable.  She sleeps without difficulty 5-6 hours nightly.  For several years she has not eaten chicken, red meat, or milk.  She does consume seafood, cheese, and eggs. There is no history in her immediate family of any blood disorders or bleeding tendencies.  At times, she gets lightheaded, but feels better after eating.    She is not taking iron supplements.    Because her anemia is mild, and she  is still premenopausal and actively menstruating, laboratory studies were obtained to exclude an underlying iron deficit, nutritional deficiency, thyroid dysfunction, or hemolytic process.  Interval History: Both her appetite and weight remain stable.  She has no visual changes or hearing deficit.  She has no unusual headache, syncope, near syncopal episodes.  She denies rash or itching.  She has no abnormal vaginal bleeding or discharge.  She admits to being cold "all the time" when others are comfortable.  No cough, sore throat, or orthopnea are appreciated.  She has no pain or difficulty in swallowing.  No fever, shaking chills, sweats, or flulike symptoms are evident.  She has no heartburn or indigestion.  She denies nausea, vomiting, diarrhea, or constipation.  She reports no melena or bright red blood per rectum.  She has no urinary frequency, urgency, hematuria, or dysuria.  There are no focal areas of bone, joint, muscle pain.  She denies any bleeding tendency or easy bruisability.  There is no numbness or tingling in the fingers or toes.  Her other comorbid problems include hypercholesterolemia and vitamin D deficiency, currently on replacement therapy.    Recommendation/Plan: I reviewed in detail the results of [facility] laboratory studies from October 18.  Her hemoglobin is normal at 12.0 g/dL.  Her iron studies, nutritional indices, TSH, and hemolytic evaluation were normal.  We discussed these results and allowed time to ask questions.  In the past she has been mildly anemic.  She continues to menstruate on a monthly basis without dysmenorrhea or menorrhagia.  She is not taking iron  supplements.  She does not eat red meat but not a complete vegetarian.  It was recommended that she follow-up in 6 months for completeness.  Because she is still premenopausal, she may at some time need iron supplementation.  Barring any unforeseen complications, her next scheduled doctor visit with laboratory  studies is on Nov 05, 2018.  She was advised to call us in the interim should any new or untoward problems arise.  The total time spent reviewing the results of her laboratory evaluation, significance, and recommendations was 15 minutes.  At least 50% of that time was spent in face-to-face discussion, counseling, and answering questions.  This note was dictated using voice activated technology/software.  Unfortunately, typographical errors are not uncommon, and transcription is subject to mistakes and regrettably misinterpretation.  If necessary, clarification of the above information can be discussed with me at any time.  FOLLOW UP: AS DIRECTED   cc:        Leighton Ruff, MD   Henreitta Leber, MD  Hematology/Oncology [place] [address] [place], [place] [address] Office: [phone] [facility]OO: [phone]

## 2018-10-14 ENCOUNTER — Telehealth: Payer: Self-pay | Admitting: Internal Medicine

## 2018-10-14 NOTE — Telephone Encounter (Signed)
Former [facility] patient. Left message re lab/fu with [facility] 5/15. Schedule mailed.

## 2018-11-18 ENCOUNTER — Other Ambulatory Visit: Payer: Self-pay | Admitting: Internal Medicine

## 2018-11-18 DIAGNOSIS — D6489 Other specified anemias: Secondary | ICD-10-CM

## 2018-11-19 ENCOUNTER — Inpatient Hospital Stay: Payer: BC Managed Care – PPO | Attending: Internal Medicine | Admitting: Internal Medicine

## 2018-11-19 ENCOUNTER — Inpatient Hospital Stay: Payer: BC Managed Care – PPO

## 2019-02-18 ENCOUNTER — Other Ambulatory Visit: Payer: Self-pay | Admitting: Family Medicine

## 2019-02-18 ENCOUNTER — Other Ambulatory Visit (HOSPITAL_COMMUNITY)
Admission: RE | Admit: 2019-02-18 | Discharge: 2019-02-18 | Disposition: A | Discharge status: Home / Self Care | Payer: BC Managed Care – PPO | Source: Ambulatory Visit | Attending: Family Medicine | Admitting: Family Medicine

## 2019-02-18 DIAGNOSIS — Z124 Encounter for screening for malignant neoplasm of cervix: Secondary | ICD-10-CM | POA: Insufficient documentation

## 2019-02-22 LAB — CYTOLOGY - PAP: Diagnosis: NEGATIVE

## 2020-02-20 ENCOUNTER — Other Ambulatory Visit: Payer: Self-pay | Admitting: Family Medicine

## 2020-02-20 ENCOUNTER — Other Ambulatory Visit (HOSPITAL_COMMUNITY)
Admission: RE | Admit: 2020-02-20 | Discharge: 2020-02-20 | Disposition: A | Discharge status: Home / Self Care | Payer: BC Managed Care – PPO | Source: Ambulatory Visit | Attending: Family Medicine | Admitting: Family Medicine

## 2020-02-20 DIAGNOSIS — Z124 Encounter for screening for malignant neoplasm of cervix: Secondary | ICD-10-CM | POA: Diagnosis present

## 2020-02-22 LAB — CYTOLOGY - PAP
Adequacy: ABSENT
Diagnosis: NEGATIVE

## 2021-02-13 NOTE — Progress Notes (Signed)
Date:  02/14/2021   ID:  Kristine Kirby, DOB 06-Nov-1975, MRN [id number]  PCP:  Gavin Pound, MD  Cardiologist:  Rex Kras, DO, [facility]  (established care 02/14/2021)  REASON FOR CONSULT: Chest pain and LE Swelling  REQUESTING PHYSICIAN:  Gavin Pound, MD [place],  [place] [address]  Chief Complaint  Patient presents with   Chest Pain   New Patient (Initial Visit)   Edema    HPI  Kristine Kirby is a 44 y.o. female who presents to the office with a chief complaint of " lower extremity swelling." Patient's past medical history and cardiovascular risk factors include: Hypertension.  She is referred to the office at the request of Nnodi, Adaku, MD for evaluation of chest pain.  She is referred to the office for evaluation of chest pain and lower extremity swelling.  Patient states that the chest pains have resolved after the initiation of hydrochlorothiazide.  She currently does not have any active chest discomfort.  Lower extremity swelling: She is been experiencing swelling since March 2022 and she has episodes intermittently.  However for the last 1 week she has noticed bilateral lower extremity swelling left worse than the right.  This time the swelling has not been improving during the early morning hours after a good night rest.  She has no prior history of DVT or pulmonary embolism.  Her salt intake includes regular use of garlic powder, and consuming 2-4 canned goods on a daily basis.  He is on oral contraceptive since 1991 and has not had any issues in the past.  She recently had a 3-1/2 to 4-hour car ride from [facility] and back to [place] on 02/08/2021.    FUNCTIONAL STATUS: 3-4 walking for about 3 miles each time.    ALLERGIES: Allergies  Allergen Reactions   Codeine Nausea And Vomiting    Severe nausea and vomiting    MEDICATION LIST PRIOR TO VISIT: Current Meds  Medication Sig   hydrochlorothiazide (MICROZIDE) 12.5 MG capsule Take 12.5 mg  by mouth daily.   valACYclovir (VALTREX) 1000 MG tablet Take 1,000 mg by mouth 2 (two) times daily as needed.   VELIVET 0.1/0.125/0.15 -0.025 MG tablet Take 1 tablet by mouth every morning.    Vitamin D, Ergocalciferol, (DRISDOL) 50000 units CAPS capsule Take 2,000 Units by mouth every 7 (seven) days.     PAST MEDICAL HISTORY: Past Medical History:  Diagnosis Date   Dysmenorrhea    Hypertension    Numbness and tingling in right hand     PAST SURGICAL HISTORY: Past Surgical History:  Procedure Laterality Date   MOUTH SURGERY     None      FAMILY HISTORY: The patient family history includes Diabetes in her mother; Hypertension in her mother.  SOCIAL HISTORY:  The patient  reports that she has never smoked. She has never used smokeless tobacco. She reports that she does not drink alcohol and does not use drugs.  REVIEW OF SYSTEMS: Review of Systems  Constitutional: Negative for chills and fever.  HENT:  Negative for hoarse voice and nosebleeds.   Eyes:  Negative for discharge, double vision and pain.  Cardiovascular:  Positive for leg swelling (Left > Right). Negative for chest pain, claudication, dyspnea on exertion, near-syncope, orthopnea, palpitations, paroxysmal nocturnal dyspnea and syncope.  Respiratory:  Positive for shortness of breath (improving). Negative for hemoptysis.   Musculoskeletal:  Negative for muscle cramps and myalgias.  Gastrointestinal:  Negative for abdominal pain, constipation, diarrhea, hematemesis,  hematochezia, melena, nausea and vomiting.  Neurological:  Negative for dizziness and light-headedness.   PHYSICAL EXAM: Vitals with BMI 02/14/2021 05/07/2018 04/23/2018  Height '5\' 7"'$  '5\' 7"'$  '5\' 7"'$   Weight 156 lbs 150 lbs 5 oz 155 lbs 14 oz  BMI 24.43 0000000 [id number]  Systolic [id number] 0000000 [id number]  Diastolic 67 81 67  Pulse 60 57 60    CONSTITUTIONAL: Well-developed and well-nourished. No acute distress.  SKIN: Skin is warm and dry. No rash noted. No cyanosis. No  pallor. No jaundice HEAD: Normocephalic and atraumatic.  EYES: No scleral icterus MOUTH/THROAT: Moist oral membranes.  NECK: No JVD present. No thyromegaly noted. No carotid bruits  LYMPHATIC: No visible cervical adenopathy.  CHEST Normal respiratory effort. No intercostal retractions  LUNGS: Clear to auscultation bilaterally.  No stridor. No wheezes. No rales.  CARDIOVASCULAR: Regular, positive S1-S2, no murmurs rubs or gallops appreciated. ABDOMINAL: Nonobese, soft, nontender, nondistended, positive bowel sounds in all 4 quadrants no apparent ascites.  EXTREMITIES: Bilateral +1 peripheral edema left worse than right.  Warm to touch bilaterally.  2+ PT pulses bilaterally HEMATOLOGIC: No significant bruising NEUROLOGIC: Oriented to person, place, and time. Nonfocal. Normal muscle tone.  PSYCHIATRIC: Normal mood and affect. Normal behavior. Cooperative  CARDIAC DATABASE: EKG: 02/12/2021: Sinus bradycardia, 54 bpm, consider old anteroseptal infarct, without underlying injury pattern.   Echocardiogram: No results found for this or any previous visit from the past 1095 days.    Stress Testing: No results found for this or any previous visit from the past 1095 days.   Heart Catheterization: None  LABORATORY DATA: CBC Latest Ref Rng & Units 04/23/2018 06/02/2015 08/02/2013  WBC 4.0 - 10.5 K/uL 4.6 4.9 5.1  Hemoglobin 12.0 - 15.0 g/dL 12.0 11.4(L) 11.4  Hematocrit 36.0 - 46.0 % 37.5 34.7(L) 35.1  Platelets 150 - 400 K/uL 228 182 227    CMP Latest Ref Rng & Units 04/23/2018 06/02/2015 08/02/2013  Glucose 70 - 99 mg/dL 88 97 82  BUN 6 - 20 mg/dL '12 12 17  '$ Creatinine 0.44 - 1.00 mg/dL 0.90 0.83 0.82  Sodium 135 - 145 mmol/L 139 138 140  Potassium 3.5 - 5.1 mmol/L 4.2 4.7 4.2  Chloride 98 - 111 mmol/L 106 106 105  CO2 22 - 32 mmol/L '25 24 23  '$ Calcium 8.9 - 10.3 mg/dL 9.1 8.7(L) 8.7  Total Protein 6.5 - 8.1 g/dL 7.5 6.5 6.0  Total Bilirubin 0.3 - 1.2 mg/dL 0.8 0.4 0.2  Alkaline Phos  38 - 126 U/L 50 42 34(L)  AST 15 - 41 U/L 14(L) 18 19  ALT 0 - 44 U/L '10 17 16    '$ Lipid Panel  No results found for: CHOL, TRIG, HDL, CHOLHDL, VLDL, LDLCALC, LDLDIRECT, LABVLDL  No components found for: NTPROBNP No results for input(s): PROBNP in the last 8760 hours. No results for input(s): TSH in the last 8760 hours.  BMP No results for input(s): NA, K, CL, CO2, GLUCOSE, BUN, CREATININE, CALCIUM, GFRNONAA, GFRAA in the last 8760 hours.  HEMOGLOBIN A1C No results found for: HGBA1C, MPG  External Labs: Collected: 01/17/2021 provided by PCP Hemoglobin 11 g/dL, hematocrit 38.8%. Sodium 137, potassium 4, chloride 101, bicarb 27 BUN 15, creatinine 0.73 mg/dL. AST 12, ALT 12, alkaline phosphatase 45  IMPRESSION:    ICD-10-CM   1. Leg swelling  M79.89 D-dimer, quantitative    hCG, serum, qualitative    PCV LOWER VENOUS US (BILATERAL)    CANCELED: VAS [place] LOWER EXTREMITY VENOUS (DVT)    2.  Shortness of breath  R06.02 EKG 12-Lead    D-dimer, quantitative    hCG, serum, qualitative    Basic metabolic panel    B Nat Peptide    3. Benign hypertension  I10     4. Precordial pain  R07.2        RECOMMENDATIONS: Deshondra Ansell is a 45 y.o. female whose past medical history and cardiac risk factors include: Hypertension.  Lower extremity swelling: Left worse than right. Recent car ride for 3.5-4 hours from [facility] back to [place]. Currently on oral contraceptive and increased salt in her diet as noted above. Check D-dimer Lower extremity venous duplex to rule out DVT  Shortness of breath: Improved after the initiation of HCTZ. If the D-dimer is positive and lower extremity duplex is negative would recommend a CT PE protocol. Educated importance of blood pressure management.  I have asked her to keep a log of her blood pressures and to review it with her PCP. Check BNP  Benign essential hypertension: Office blood pressures are very well controlled. Continue  current medical therapy as per PCP. Low-salt diet.  Precordial pain: Patient was referred to the office for evaluation of chest pain.  However, patient is currently asymptomatic.  He is more concerned of the lower extremity swelling.  At this time we will focus on treating her lower extremity swelling and at follow-up office visits may consider echocardiogram and exercise treadmill stress test for further evaluation and management.  Patient is agreeable with the plan of care and is thankful for coordination of care during today's office visit.  Is a part of this consultation reviewed office notes received by PCP, labs independently reviewed and noted above, and review of outside EKGs.  FINAL MEDICATION LIST END OF ENCOUNTER: No orders of the defined types were placed in this encounter.   Medications Discontinued During This Encounter  Medication Reason   chlorhexidine (PERIDEX) 0.12 % solution Error   ibuprofen (ADVIL,MOTRIN) 800 MG tablet Error     Current Outpatient Medications:    hydrochlorothiazide (MICROZIDE) 12.5 MG capsule, Take 12.5 mg by mouth daily., Disp: , Rfl:    valACYclovir (VALTREX) 1000 MG tablet, Take 1,000 mg by mouth 2 (two) times daily as needed., Disp: , Rfl:    VELIVET 0.1/0.125/0.15 -0.025 MG tablet, Take 1 tablet by mouth every morning. , Disp: , Rfl:    Vitamin D, Ergocalciferol, (DRISDOL) 50000 units CAPS capsule, Take 2,000 Units by mouth every 7 (seven) days., Disp: , Rfl:   Orders Placed This Encounter  Procedures   D-dimer, quantitative   hCG, serum, qualitative   Basic metabolic panel   B Nat Peptide   EKG 12-Lead   PCV LOWER VENOUS US (BILATERAL)    There are no Patient Instructions on file for this visit.   --Continue cardiac medications as reconciled in final medication list. --Return in about 2 weeks (around 02/28/2021) for Follow up LE swelling and re-evaluate chest pain and shortness of breath. Or sooner if needed. --Continue follow-up with  your primary care physician regarding the management of your other chronic comorbid conditions.  Patient's questions and concerns were addressed to her satisfaction. She voices understanding of the instructions provided during this encounter.   This note was created using a voice recognition software as a result there may be grammatical errors inadvertently enclosed that do not reflect the nature of this encounter. Every attempt is made to correct such errors.  Rex Kras, [place], [facility]  Pager: [phone] Office: [phone]

## 2021-02-14 ENCOUNTER — Ambulatory Visit: Payer: BC Managed Care – PPO | Admitting: Cardiology

## 2021-02-14 ENCOUNTER — Other Ambulatory Visit: Payer: Self-pay

## 2021-02-14 ENCOUNTER — Encounter: Payer: Self-pay | Admitting: Cardiology

## 2021-02-14 VITALS — BP 112/67 | HR 60 | Resp 16 | Ht 67.0 in | Wt 156.0 lb

## 2021-02-14 DIAGNOSIS — M7989 Other specified soft tissue disorders: Secondary | ICD-10-CM

## 2021-02-14 DIAGNOSIS — I10 Essential (primary) hypertension: Secondary | ICD-10-CM

## 2021-02-14 DIAGNOSIS — R072 Precordial pain: Secondary | ICD-10-CM

## 2021-02-14 DIAGNOSIS — R0602 Shortness of breath: Secondary | ICD-10-CM

## 2021-02-15 ENCOUNTER — Ambulatory Visit: Payer: BC Managed Care – PPO

## 2021-02-15 DIAGNOSIS — M7989 Other specified soft tissue disorders: Secondary | ICD-10-CM

## 2021-02-16 LAB — BASIC METABOLIC PANEL
BUN/Creatinine Ratio: 20 (ref 9–23)
BUN: 16 mg/dL (ref 6–24)
CO2: 24 mmol/L (ref 20–29)
Calcium: 8.9 mg/dL (ref 8.7–10.2)
Chloride: 103 mmol/L (ref 96–106)
Creatinine, Ser: 0.8 mg/dL (ref 0.57–1.00)
Glucose: 82 mg/dL (ref 65–99)
Potassium: 4.6 mmol/L (ref 3.5–5.2)
Sodium: 138 mmol/L (ref 134–144)
eGFR: 92 mL/min/{1.73_m2} (ref >59)

## 2021-02-16 LAB — BRAIN NATRIURETIC PEPTIDE: BNP: 137 pg/mL — ABNORMAL HIGH (ref 0.0–100.0)

## 2021-02-16 LAB — HCG, SERUM, QUALITATIVE: hCG,Beta Subunit,Qual,Serum: NEGATIVE m[IU]/mL (ref <6)

## 2021-02-16 LAB — D-DIMER, QUANTITATIVE: D-DIMER: 1.31 mg/L FEU — ABNORMAL HIGH (ref 0.00–0.49)

## 2021-02-18 ENCOUNTER — Other Ambulatory Visit: Payer: Self-pay | Admitting: Cardiology

## 2021-02-18 ENCOUNTER — Other Ambulatory Visit: Payer: Self-pay

## 2021-02-18 DIAGNOSIS — M7989 Other specified soft tissue disorders: Secondary | ICD-10-CM

## 2021-02-18 DIAGNOSIS — R609 Edema, unspecified: Secondary | ICD-10-CM

## 2021-02-18 DIAGNOSIS — R7989 Other specified abnormal findings of blood chemistry: Secondary | ICD-10-CM

## 2021-02-18 DIAGNOSIS — R079 Chest pain, unspecified: Secondary | ICD-10-CM

## 2021-02-18 NOTE — Progress Notes (Signed)
02/18/2021  Patient seen in consultation 02/14/2021 for chest pain and lower extremity swelling.   Due to asymmetrical swelling concern for lower extremity DVT.  Patient came in to the office for lower extremity duplex on 02/15/2021.  Lower extremity duplex in the office concern for left lower extremity DVT.  Patient is currently started on Eliquis.  Labs were ordered which were performed on 02/15/2021 which notes elevated D-dimer, stable kidney function, and negative pregnancy test.  I called the patient personally patient states that she is doing well on the current dose of Eliquis.  She denies any shortness of breath or fast heart rate.  But both of them were present early in July 2022 when the symptoms started.  Given her young age and elevated D-dimer is concerning for DVT/PE.  Shared decision was to proceed with a repeat lower extremity venous duplex of the left side and CT PE protocol to rule out pulmonary embolism.  Patient would like to have this arranged as outpatient; however, if she has worsening swelling, palpitations, shortness of breath she is strongly advised and encouraged to go to the ER for further evaluation and management as acute pulmonary embolism can be a life-threatening illness.  She verbalizes understanding.    ICD-10-CM   1. Leg swelling  M79.89 VAS [place] LOWER EXTREMITY VENOUS (DVT)    2. Positive D-dimer  R79.89 VAS [place] LOWER EXTREMITY VENOUS (DVT)    3. Chest pain, unspecified type  R07.9 CT Angio Chest Pulmonary Embolism (PE) W or WO Contrast     Orders Placed This Encounter  Procedures   CT Angio Chest Pulmonary Embolism (PE) W or WO Contrast    Standing Status:   Future    Standing Expiration Date:   02/18/2022    Order Specific Question:   If indicated for the ordered procedure, I authorize the administration of contrast media per Radiology protocol    Answer:   Yes    Order Specific Question:   Is patient pregnant?    Answer:   No    Order Specific  Question:   Preferred imaging location?    Answer:   [facility]   [place], [place], [facility]  Pager: [phone] Office: [phone]

## 2021-02-19 ENCOUNTER — Other Ambulatory Visit: Payer: Self-pay

## 2021-02-19 ENCOUNTER — Ambulatory Visit
Admission: RE | Admit: 2021-02-19 | Discharge: 2021-02-19 | Disposition: A | Discharge status: Home / Self Care | Payer: BC Managed Care – PPO | Source: Ambulatory Visit | Attending: Cardiology | Admitting: Cardiology

## 2021-02-19 DIAGNOSIS — R079 Chest pain, unspecified: Secondary | ICD-10-CM

## 2021-02-19 MED ORDER — IOPAMIDOL (ISOVUE-370) INJECTION 76%
75.0000 mL | Freq: Once | INTRAVENOUS | Status: AC | PRN
  Administered 2021-02-19: 75 mL via INTRAVENOUS

## 2021-02-20 ENCOUNTER — Ambulatory Visit
Admission: RE | Admit: 2021-02-20 | Discharge: 2021-02-20 | Disposition: A | Discharge status: Home / Self Care | Payer: BC Managed Care – PPO | Source: Ambulatory Visit | Attending: Cardiology | Admitting: Cardiology

## 2021-02-20 ENCOUNTER — Telehealth: Payer: Self-pay | Admitting: Cardiology

## 2021-02-20 ENCOUNTER — Other Ambulatory Visit: Payer: Self-pay | Admitting: Cardiology

## 2021-02-20 DIAGNOSIS — R7989 Other specified abnormal findings of blood chemistry: Secondary | ICD-10-CM

## 2021-02-20 DIAGNOSIS — I82412 Acute embolism and thrombosis of left femoral vein: Secondary | ICD-10-CM

## 2021-02-20 DIAGNOSIS — R609 Edema, unspecified: Secondary | ICD-10-CM

## 2021-02-20 MED ORDER — APIXABAN (ELIQUIS) VTE STARTER PACK (10MG AND 5MG): ORAL_TABLET | ORAL | 0 refills | Status: DC

## 2021-02-20 NOTE — Telephone Encounter (Signed)
Pt called asking if results from CT have come back yet.

## 2021-02-20 NOTE — Telephone Encounter (Signed)
Called and spoke to pt, pt is aware.

## 2021-02-24 ENCOUNTER — Other Ambulatory Visit: Payer: Self-pay | Admitting: Cardiology

## 2021-02-24 DIAGNOSIS — I82412 Acute embolism and thrombosis of left femoral vein: Secondary | ICD-10-CM

## 2021-02-25 ENCOUNTER — Other Ambulatory Visit: Payer: Self-pay | Admitting: Family Medicine

## 2021-02-25 DIAGNOSIS — R918 Other nonspecific abnormal finding of lung field: Secondary | ICD-10-CM

## 2021-02-28 ENCOUNTER — Ambulatory Visit: Payer: BC Managed Care – PPO | Admitting: Cardiology

## 2021-02-28 ENCOUNTER — Encounter: Payer: Self-pay | Admitting: Cardiology

## 2021-02-28 ENCOUNTER — Other Ambulatory Visit: Payer: Self-pay

## 2021-02-28 VITALS — BP 158/88 | HR 79 | Temp 98.0°F | Resp 16 | Ht 67.0 in | Wt 150.0 lb

## 2021-02-28 DIAGNOSIS — I82412 Acute embolism and thrombosis of left femoral vein: Secondary | ICD-10-CM

## 2021-02-28 DIAGNOSIS — M7989 Other specified soft tissue disorders: Secondary | ICD-10-CM

## 2021-02-28 DIAGNOSIS — I10 Essential (primary) hypertension: Secondary | ICD-10-CM

## 2021-02-28 DIAGNOSIS — R072 Precordial pain: Secondary | ICD-10-CM

## 2021-02-28 DIAGNOSIS — R0602 Shortness of breath: Secondary | ICD-10-CM

## 2021-02-28 NOTE — Progress Notes (Signed)
Date:  02/28/2021   ID:  Kristine Kirby, DOB July 07, 1976, MRN [id number]  PCP:  Kathyrn Lass, MD  Cardiologist:  Rex Kras, DO, [facility]  (established care 02/14/2021)  Date: 02/28/21 Last Office Visit: 02/14/2021  Chief Complaint  Patient presents with   Leg Swelling   Follow-up    HPI  Kristine Kirby is a 45 y.o. female who presents to the office with a chief complaint of " leg swelling status post DVT follow-up." Patient's past medical history and cardiovascular risk factors include: Hypertension, acute left femoral-popliteal DVT.  She is referred to the office at the request of Nnodi, Adaku, MD for evaluation of chest pain.  Patient was initially referred to the office for evaluation of chest pain and her symptoms have resolved since initiation of hydrochlorothiazide.  At the last office visit patient was concerned of lower extremity swelling that was present with left worse than the right.  I was concerned for deep venous thrombosis and had an ultrasound of the lower extremities performed in the office which was highly suggestive of a DVT involving the left lower extremity.  She had a repeat confirmatory test at [place] which noted acute left femoral-popliteal DVT.  Patient was initiated on Eliquis and has tolerated the medication well without any side effects or intolerances.  She presents for follow-up.  Patient states that her lower extremity swelling has completely resolved.  She is tolerating Eliquis well without any side effects or intolerances.  Given the D-dimer numbers being significantly elevated I also ordered a CT PE protocol study which noted a right-sided pleural-based well-circumscribed mass.  She has followed up with a PCP and is scheduled for a PET scan earlier next week.  FUNCTIONAL STATUS: 3-4 walking for about 3 miles each time.    ALLERGIES: Allergies  Allergen Reactions   Codeine Nausea And Vomiting    Severe nausea and vomiting     MEDICATION LIST PRIOR TO VISIT: Current Meds  Medication Sig   APIXABAN (ELIQUIS) VTE STARTER PACK ('10MG'$  AND '5MG'$ ) Take as directed on package: start with two-'5mg'$  tablets twice daily for 7 days. On day 8, switch to one-'5mg'$  tablet twice daily.   hydrochlorothiazide (MICROZIDE) 12.5 MG capsule Take 12.5 mg by mouth daily.   norethindrone (MICRONOR) 0.35 MG tablet Take 1 tablet by mouth daily.   valACYclovir (VALTREX) 1000 MG tablet Take 1,000 mg by mouth 2 (two) times daily as needed.   Vitamin D, Ergocalciferol, (DRISDOL) 50000 units CAPS capsule Take 2,000 Units by mouth every 7 (seven) days.     PAST MEDICAL HISTORY: Past Medical History:  Diagnosis Date   Dysmenorrhea    Hypertension    Numbness and tingling in right hand     PAST SURGICAL HISTORY: Past Surgical History:  Procedure Laterality Date   MOUTH SURGERY     None      FAMILY HISTORY: The patient family history includes Diabetes in her mother; Hypertension in her mother.  SOCIAL HISTORY:  The patient  reports that she has never smoked. She has never used smokeless tobacco. She reports that she does not drink alcohol and does not use drugs.  REVIEW OF SYSTEMS: Review of Systems  Constitutional: Negative for chills and fever.  HENT:  Negative for hoarse voice and nosebleeds.   Eyes:  Negative for discharge, double vision and pain.  Cardiovascular:  Positive for leg swelling (improving). Negative for chest pain, claudication, dyspnea on exertion, near-syncope, orthopnea, palpitations, paroxysmal nocturnal dyspnea and syncope.  Respiratory:  Negative for hemoptysis and shortness of breath.   Musculoskeletal:  Negative for muscle cramps and myalgias.  Gastrointestinal:  Negative for abdominal pain, constipation, diarrhea, hematemesis, hematochezia, melena, nausea and vomiting.  Neurological:  Negative for dizziness and light-headedness.   PHYSICAL EXAM: Vitals with BMI 02/28/2021 02/14/2021 05/07/2018  Height '5\' 7"'$   '5\' 7"'$  '5\' 7"'$   Weight 150 lbs 156 lbs 150 lbs 5 oz  BMI 23.49 [id number]  Systolic 0000000 [id number] 0000000  Diastolic 88 67 81  Pulse 79 60 57    CONSTITUTIONAL: Well-developed and well-nourished. No acute distress.  SKIN: Skin is warm and dry. No rash noted. No cyanosis. No pallor. No jaundice HEAD: Normocephalic and atraumatic.  EYES: No scleral icterus MOUTH/THROAT: Moist oral membranes.  NECK: No JVD present. No thyromegaly noted. No carotid bruits  LYMPHATIC: No visible cervical adenopathy.  CHEST Normal respiratory effort. No intercostal retractions  LUNGS: Clear to auscultation bilaterally.  No stridor. No wheezes. No rales.  CARDIOVASCULAR: Regular, positive S1-S2, no murmurs rubs or gallops appreciated. ABDOMINAL: Nonobese, soft, nontender, nondistended, positive bowel sounds in all 4 quadrants no apparent ascites.  EXTREMITIES: Bilateral +1 peripheral edema left worse than right.  Warm to touch bilaterally.  2+ PT pulses bilaterally HEMATOLOGIC: No significant bruising NEUROLOGIC: Oriented to person, place, and time. Nonfocal. Normal muscle tone.  PSYCHIATRIC: Normal mood and affect. Normal behavior. Cooperative  CARDIAC DATABASE: EKG: 02/12/2021: Sinus bradycardia, 54 bpm, consider old anteroseptal infarct, without underlying injury pattern.   Echocardiogram: No results found for this or any previous visit from the past 1095 days.   Stress Testing: No results found for this or any previous visit from the past 1095 days.  Heart Catheterization: None  Lower extremity venous duplex: 02/20/2021: Acute left femoropopliteal DVT  CT PE protocol: 02/19/2021: Negative for significant acute pulmonary embolus by CTA. 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 Recommend further evaluation with PET-CT.  LABORATORY DATA: CBC Latest Ref Rng & Units 04/23/2018 06/02/2015 08/02/2013  WBC 4.0 -  10.5 K/uL 4.6 4.9 5.1  Hemoglobin 12.0 - 15.0 g/dL 12.0 11.4(L) 11.4  Hematocrit 36.0 - 46.0 % 37.5 34.7(L) 35.1  Platelets 150 - 400 K/uL 228 182 227    CMP Latest Ref Rng & Units 02/15/2021 04/23/2018 06/02/2015  Glucose 65 - 99 mg/dL 82 88 97  BUN 6 - 24 mg/dL '16 12 12  '$ Creatinine 0.57 - 1.00 mg/dL 0.80 0.90 0.83  Sodium 134 - 144 mmol/L 138 139 138  Potassium 3.5 - 5.2 mmol/L 4.6 4.2 4.7  Chloride 96 - 106 mmol/L 103 106 106  CO2 20 - 29 mmol/L '24 25 24  '$ Calcium 8.7 - 10.2 mg/dL 8.9 9.1 8.7(L)  Total Protein 6.5 - 8.1 g/dL - 7.5 6.5  Total Bilirubin 0.3 - 1.2 mg/dL - 0.8 0.4  Alkaline Phos 38 - 126 U/L - 50 42  AST 15 - 41 U/L - 14(L) 18  ALT 0 - 44 U/L - 10 17    Lipid Panel  No results found for: CHOL, TRIG, HDL, CHOLHDL, VLDL, LDLCALC, LDLDIRECT, LABVLDL  No components found for: NTPROBNP No results for input(s): PROBNP in the last 8760 hours. No results for input(s): TSH in the last 8760 hours.  BMP Recent Labs    02/15/21 1501  NA 138  K 4.6  CL 103  CO2 24  GLUCOSE 82  BUN 16  CREATININE 0.80  CALCIUM 8.9  HEMOGLOBIN A1C No results found for: HGBA1C, MPG  External Labs: Collected: 01/17/2021 provided by PCP Hemoglobin 11 g/dL, hematocrit 38.8%. Sodium 137, potassium 4, chloride 101, bicarb 27 BUN 15, creatinine 0.73 mg/dL. AST 12, ALT 12, alkaline phosphatase 45  IMPRESSION:    ICD-10-CM   1. Acute deep vein thrombosis (DVT) of femoral popliteal vein.  I82.412     2. Benign hypertension  I10        RECOMMENDATIONS: Kristine Kirby is a 45 y.o. female whose past medical history and cardiac risk factors include: Hypertension, acute left femoral-popliteal DVT.Marland Kitchen  Left femoral popliteal DVT: Diagnosed 02/20/2021. Repeat study scheduled for November 2022. Started on Eliquis and tolerating the medication well without any side effects or intolerances. Risks, benefits and alternatives to oral anticoagulation discussed. Patient has been educated on  the importance of monitoring for evidence of bleeding which includes but not limited to hemoptysis, hematochezia, melanotic stools, and hematuria. Patient educated on fall precautions and if she was to be injured despite the mechanism of injury she is to seek medical attention at the closest ER since she is on blood thinners.  Patient understands importance of this because if internal bleeding is not treated in a timely manner it may further lead to morbidity and/or mortality.  Patient voices understanding of these recommendations and provides verbal feedback.  Benign essential hypertension: Office blood pressures are not very well controlled. Patient is asked to keep a log of her blood pressures and to review it with her PCP to see if additional medication is warranted.  I suspect is most likely secondary to the underlying stress that the patient is currently enduring.   Low-salt diet Continue to monitor.  Would recommend a goal systolic blood pressure of around 120 mmHg.  Precordial pain: No reoccurrence of chest pain since last office visit. Patient is currently coordinating her care with regards to addressing the lung findings on the most recent CT PE protocol study.  If she has any further episodes of chest pain I have asked her to call the office and we can set up additional testing at that time.  Otherwise would recommend conservative management for now.  FINAL MEDICATION LIST END OF ENCOUNTER: No orders of the defined types were placed in this encounter.   Medications Discontinued During This Encounter  Medication Reason   VELIVET 0.1/0.125/0.15 -0.025 MG tablet Error     Current Outpatient Medications:    APIXABAN (ELIQUIS) VTE STARTER PACK ('10MG'$  AND '5MG'$ ), Take as directed on package: start with two-'5mg'$  tablets twice daily for 7 days. On day 8, switch to one-'5mg'$  tablet twice daily., Disp: 1 each, Rfl: 0   hydrochlorothiazide (MICROZIDE) 12.5 MG capsule, Take 12.5 mg by mouth daily.,  Disp: , Rfl:    norethindrone (MICRONOR) 0.35 MG tablet, Take 1 tablet by mouth daily., Disp: , Rfl:    valACYclovir (VALTREX) 1000 MG tablet, Take 1,000 mg by mouth 2 (two) times daily as needed., Disp: , Rfl:    Vitamin D, Ergocalciferol, (DRISDOL) 50000 units CAPS capsule, Take 2,000 Units by mouth every 7 (seven) days., Disp: , Rfl:   No orders of the defined types were placed in this encounter.   There are no Patient Instructions on file for this visit.   --Continue cardiac medications as reconciled in final medication list. --Return in about 14 weeks (around 06/06/2021) for Follow up DVT. Or sooner if needed. --Continue follow-up with your primary care physician regarding the management of your other chronic comorbid conditions.  Patient's questions and concerns were addressed to her satisfaction. She voices understanding of the instructions provided during this encounter.   This note was created using a voice recognition software as a result there may be grammatical errors inadvertently enclosed that do not reflect the nature of this encounter. Every attempt is made to correct such errors.  Rex Kras, [place], [facility]  Pager: [phone] Office: [phone]

## 2021-03-05 ENCOUNTER — Other Ambulatory Visit: Payer: Self-pay

## 2021-03-05 ENCOUNTER — Encounter (HOSPITAL_COMMUNITY)
Admission: RE | Admit: 2021-03-05 | Discharge: 2021-03-05 | Disposition: A | Discharge status: Home / Self Care | Payer: BC Managed Care – PPO | Source: Ambulatory Visit | Attending: Family Medicine | Admitting: Family Medicine

## 2021-03-05 DIAGNOSIS — R918 Other nonspecific abnormal finding of lung field: Secondary | ICD-10-CM | POA: Diagnosis present

## 2021-03-05 LAB — GLUCOSE, CAPILLARY: Glucose-Capillary: 101 mg/dL — ABNORMAL HIGH (ref 70–99)

## 2021-03-05 MED ORDER — FLUDEOXYGLUCOSE F - 18 (FDG) INJECTION
7.8000 | Freq: Once | INTRAVENOUS | Status: AC
  Administered 2021-03-05: 7.9 via INTRAVENOUS

## 2021-03-06 ENCOUNTER — Telehealth: Payer: Self-pay | Admitting: Cardiology

## 2021-03-06 NOTE — Telephone Encounter (Signed)
Pt is requesting a call back as she has received her results of the CT scan. Pt states that the results were well but she had got info about a mass on one of her lungs.

## 2021-03-06 NOTE — Telephone Encounter (Signed)
Sorry, I dont understand the question.

## 2021-03-07 ENCOUNTER — Telehealth: Payer: Self-pay | Admitting: *Deleted

## 2021-03-07 ENCOUNTER — Telehealth: Payer: Self-pay | Admitting: Internal Medicine

## 2021-03-07 DIAGNOSIS — R911 Solitary pulmonary nodule: Secondary | ICD-10-CM

## 2021-03-07 HISTORY — DX: Solitary pulmonary nodule: R91.1

## 2021-03-07 NOTE — Telephone Encounter (Signed)
Scheduled appt per 9/1 referral. Called pt, no answer. Left msg with appt date and time as well as call back number if pt needs to r/s.

## 2021-03-12 ENCOUNTER — Other Ambulatory Visit: Payer: Self-pay

## 2021-03-12 MED ORDER — APIXABAN 5 MG PO TABS: 5.0000 mg | ORAL_TABLET | Freq: Two times a day (BID) | ORAL | 0 refills | Status: DC

## 2021-03-15 ENCOUNTER — Other Ambulatory Visit: Payer: Self-pay | Admitting: Medical Oncology

## 2021-03-15 ENCOUNTER — Other Ambulatory Visit: Payer: Self-pay

## 2021-03-15 DIAGNOSIS — J948 Other specified pleural conditions: Secondary | ICD-10-CM

## 2021-03-18 ENCOUNTER — Inpatient Hospital Stay: Payer: BC Managed Care – PPO | Admitting: Internal Medicine

## 2021-03-18 ENCOUNTER — Inpatient Hospital Stay: Payer: BC Managed Care – PPO | Attending: Internal Medicine

## 2021-03-18 ENCOUNTER — Other Ambulatory Visit: Payer: Self-pay

## 2021-03-18 ENCOUNTER — Telehealth: Payer: Self-pay | Admitting: *Deleted

## 2021-03-18 VITALS — BP 147/86 | HR 80 | Temp 97.4°F | Resp 19 | Ht 67.0 in | Wt 150.2 lb

## 2021-03-18 DIAGNOSIS — E119 Type 2 diabetes mellitus without complications: Secondary | ICD-10-CM | POA: Insufficient documentation

## 2021-03-18 DIAGNOSIS — I10 Essential (primary) hypertension: Secondary | ICD-10-CM | POA: Diagnosis not present

## 2021-03-18 DIAGNOSIS — J948 Other specified pleural conditions: Secondary | ICD-10-CM

## 2021-03-18 DIAGNOSIS — D492 Neoplasm of unspecified behavior of bone, soft tissue, and skin: Secondary | ICD-10-CM | POA: Diagnosis not present

## 2021-03-18 DIAGNOSIS — Z79899 Other long term (current) drug therapy: Secondary | ICD-10-CM | POA: Diagnosis not present

## 2021-03-18 DIAGNOSIS — R918 Other nonspecific abnormal finding of lung field: Secondary | ICD-10-CM | POA: Diagnosis present

## 2021-03-18 DIAGNOSIS — Z86718 Personal history of other venous thrombosis and embolism: Secondary | ICD-10-CM | POA: Diagnosis not present

## 2021-03-18 DIAGNOSIS — Z7901 Long term (current) use of anticoagulants: Secondary | ICD-10-CM | POA: Diagnosis not present

## 2021-03-18 DIAGNOSIS — C801 Malignant (primary) neoplasm, unspecified: Secondary | ICD-10-CM

## 2021-03-18 HISTORY — DX: Malignant (primary) neoplasm, unspecified: C80.1

## 2021-03-18 LAB — CBC WITH DIFFERENTIAL (CANCER CENTER ONLY)
Abs Immature Granulocytes: 0.01 10*3/uL (ref 0.00–0.07)
Basophils Absolute: 0 10*3/uL (ref 0.0–0.1)
Basophils Relative: 0 %
Eosinophils Absolute: 0.1 10*3/uL (ref 0.0–0.5)
Eosinophils Relative: 1 %
HCT: 34.9 % — ABNORMAL LOW (ref 36.0–46.0)
Hemoglobin: 11.5 g/dL — ABNORMAL LOW (ref 12.0–15.0)
Immature Granulocytes: 0 %
Lymphocytes Relative: 58 %
Lymphs Abs: 2.8 10*3/uL (ref 0.7–4.0)
MCH: 29 pg (ref 26.0–34.0)
MCHC: 33 g/dL (ref 30.0–36.0)
MCV: 87.9 fL (ref 80.0–100.0)
Monocytes Absolute: 0.5 10*3/uL (ref 0.1–1.0)
Monocytes Relative: 10 %
Neutro Abs: 1.5 10*3/uL — ABNORMAL LOW (ref 1.7–7.7)
Neutrophils Relative %: 31 %
Platelet Count: 217 10*3/uL (ref 150–400)
RBC: 3.97 MIL/uL (ref 3.87–5.11)
RDW: 14 % (ref 11.5–15.5)
WBC Count: 4.9 10*3/uL (ref 4.0–10.5)
nRBC: 0 % (ref 0.0–0.2)

## 2021-03-18 LAB — CMP (CANCER CENTER ONLY)
ALT: 12 U/L (ref 0–44)
AST: 13 U/L — ABNORMAL LOW (ref 15–41)
Albumin: 3.5 g/dL (ref 3.5–5.0)
Alkaline Phosphatase: 40 U/L (ref 38–126)
Anion gap: 7 (ref 5–15)
BUN: 16 mg/dL (ref 6–20)
CO2: 28 mmol/L (ref 22–32)
Calcium: 9.2 mg/dL (ref 8.9–10.3)
Chloride: 103 mmol/L (ref 98–111)
Creatinine: 0.84 mg/dL (ref 0.44–1.00)
GFR, Estimated: >60 mL/min (ref >60)
Glucose, Bld: 92 mg/dL (ref 70–99)
Potassium: 4 mmol/L (ref 3.5–5.1)
Sodium: 138 mmol/L (ref 135–145)
Total Bilirubin: 0.8 mg/dL (ref 0.3–1.2)
Total Protein: 6.8 g/dL (ref 6.5–8.1)

## 2021-03-18 NOTE — Progress Notes (Signed)
[place] [phone]   Fax:([phone]  CONSULT NOTE  REFERRING PHYSICIAN: Dr. Kathyrn Lass  REASON FOR CONSULTATION:  45 years old African-American female with right pleural-based mass.  HPI Kristine Kirby is a 45 y.o. female with past medical history significant for hypertension, dysmenorrhea, numbness and tingling's of the right hand as well as history of deep venous thrombosis.  The patient mentions that she has intermittent swelling of the lower extremities left more than right since March 2022.  Recently few weeks ago she had right-sided chest pain as well as shortness of breath.  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  Because of the swelling of the lower extremities the patient had ultrasound venous Doppler on 02/20/2021 and it showed acute left femoral-popliteal deep venous thrombosis.  She started on treatment with Eliquis and currently on 5 mg p.o. twice daily.  The patient also had a PET scan on 03/05/2021 and that showed the pleural-based right lower lobe lesion measuring 3.0 x 1.9 cm and is not significantly hypermetabolic.  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 The patient was referred to me today for evaluation and recommendation regarding her condition.  When seen she denied having any significant complaints.  She denied having any current chest pain, shortness of breath, cough or hemoptysis.  She denied having any recent weight loss or night sweats.  She has no nausea, vomiting, diarrhea or constipation.  She has no headache or visual changes. Family history significant for mother with hypertension and diabetes, father of unknown medical history.  Maternal grandmother had stroke at age 55. The patient is single and has no children.  She  works as a Pharmacist, hospital.  She has no history of smoking but drinks alcohol occasionally and no history of drug abuse.  HPI  Past Medical History:  Diagnosis Date   Dysmenorrhea    Hypertension    Numbness and tingling in right hand     Past Surgical History:  Procedure Laterality Date   MOUTH SURGERY     None      Family History  Problem Relation Age of Onset   Hypertension Mother    Diabetes Mother     Social History Social History   Tobacco Use   Smoking status: Never   Smokeless tobacco: Never  Vaping Use   Vaping Use: Never used  Substance Use Topics   Alcohol use: No    Comment: occ   Drug use: No    Allergies  Allergen Reactions   Codeine Nausea And Vomiting    Severe nausea and vomiting    Current Outpatient Medications  Medication Sig Dispense Refill   apixaban (ELIQUIS) 5 MG TABS tablet Take 1 tablet (5 mg total) by mouth 2 (two) times daily. 60 tablet 0   hydrochlorothiazide (MICROZIDE) 12.5 MG capsule Take 12.5 mg by mouth daily.     norethindrone (MICRONOR) 0.35 MG tablet Take 1 tablet by mouth daily.     valACYclovir (VALTREX) 1000 MG tablet Take 1,000 mg by mouth 2 (two) times daily as needed.     Vitamin D, Ergocalciferol, (DRISDOL) 50000 units CAPS capsule Take 2,000 Units by mouth every 7 (seven) days.     No current facility-administered medications for this visit.    Review of Systems  Constitutional: negative  Eyes: negative Ears, nose, mouth, throat, and face: negative Respiratory: negative Cardiovascular: negative Gastrointestinal: negative Genitourinary:negative Integument/breast: negative Hematologic/lymphatic: negative Musculoskeletal:negative Neurological: negative Behavioral/Psych: negative Endocrine: negative Allergic/Immunologic: negative  Physical Exam  [id number], healthy, no distress, well nourished, well developed, and anxious SKIN: skin color, texture, turgor are normal, no rashes or significant lesions HEAD:  Normocephalic, No masses, lesions, tenderness or abnormalities EYES: normal, PERRLA, Conjunctiva are pink and non-injected EARS: External ears normal, Canals clear OROPHARYNX:no exudate, no erythema, and lips, buccal mucosa, and tongue normal  NECK: supple, no adenopathy, no JVD LYMPH:  no palpable lymphadenopathy, no hepatosplenomegaly BREAST:not examined LUNGS: clear to auscultation , and palpation HEART: regular rate & rhythm, no murmurs, and no gallops ABDOMEN:abdomen soft, non-tender, and normal bowel sounds BACK: No CVA tenderness, Range of motion is normal EXTREMITIES:no joint deformities, effusion, or inflammation, no edema  NEURO: alert & oriented x 3 with fluent speech, no focal motor/sensory deficits  PERFORMANCE STATUS: ECOG 1  LABORATORY DATA: Lab Results  Component Value Date   WBC 4.9 03/18/2021   HGB 11.5 (L) 03/18/2021   HCT 34.9 (L) 03/18/2021   MCV 87.9 03/18/2021   PLT 217 03/18/2021      Chemistry      Component Value Date/Time   NA 138 02/15/2021 1501   K 4.6 02/15/2021 1501   CL 103 02/15/2021 1501   CO2 24 02/15/2021 1501   BUN 16 02/15/2021 1501   CREATININE 0.80 02/15/2021 1501      Component Value Date/Time   CALCIUM 8.9 02/15/2021 1501   ALKPHOS 50 04/23/2018 1018   AST 14 (L) 04/23/2018 1018   ALT 10 04/23/2018 1018   BILITOT 0.8 04/23/2018 1018       RADIOGRAPHIC STUDIES: CT Angio Chest Pulmonary Embolism (PE) W or WO Contrast  Result Date: 02/19/2021 CLINICAL DATA:  Positive D-dimer, leg swelling, unspecified chest pain EXAM: CT ANGIOGRAPHY CHEST WITH CONTRAST TECHNIQUE: Multidetector CT imaging of the chest was performed using the standard protocol during bolus administration of intravenous contrast. Multiplanar CT image reconstructions and MIPs were obtained to evaluate the vascular anatomy. CONTRAST:  45m ISOVUE-370 IOPAMIDOL (ISOVUE-370) INJECTION 76% COMPARISON:  None. FINDINGS: Cardiovascular: Pulmonary arteries appear patent and  normal in caliber. Negative for significant acute pulmonary embolus or filling defect by CTA. Intact thoracic aorta. Negative for aneurysm or dissection. Patent 3 vessel arch anatomy. No mediastinal hemorrhage or hematoma. Normal heart size. No pericardial effusion. Central venous structures appear patent. No veno-occlusive process. Mediastinum/Nodes: No enlarged mediastinal, hilar, or axillary lymph nodes. Thyroid gland, trachea, and esophagus demonstrate no significant findings. Lungs/Pleura: No acute airspace process, collapse or consolidation. Negative for edema or central disease. Trachea central airways are patent. Posterior right lower chest oval pleural-based well-circumscribed lesion measuring 3.1 x 2.1 x 3.7 cm. Lesion is relatively homogeneous, smoothly marginated, and hypodense. Mass remains indeterminate but a solitary fibrous tumor of the pleura could have this appearance. Upper Abdomen: No acute abnormality. Musculoskeletal: No chest wall abnormality. No acute or significant osseous findings. Review of the MIP images confirms the above findings. IMPRESSION: Negative for significant acute pulmonary embolus by CTA. 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 Recommend further evaluation with PET-CT. These results will be called to the ordering clinician or representative by the Radiologist Assistant, and communication documented in the PACS or [employer] Electronically Signed   By: MJerilynn Mages  Shick M.D.   On: 02/19/2021 16:06   NM  PET Image Initial (PI) Skull Base To Thigh (F-18 FDG)  Result Date: 03/05/2021 CLINICAL DATA:  Initial treatment strategy for pleural-based right lower lobe lung lesion. EXAM: NUCLEAR MEDICINE PET SKULL BASE TO THIGH TECHNIQUE: 7.9 mCi F-18 FDG was injected intravenously. Full-ring PET imaging was performed from the skull base to thigh after the radiotracer. CT data was obtained  and used for attenuation correction and anatomic localization. Fasting blood glucose: 101 mg/dl COMPARISON:  Chest CT 02/19/2021. FINDINGS: Mediastinal blood pool activity: SUV max 2.1 Liver activity: SUV max NA NECK: No areas of abnormal hypermetabolism. Incidental CT findings: No cervical adenopathy. CHEST: No thoracic nodal hypermetabolism. The pleural-based right lower lobe lesion measures 3.0 X 1.9 cm and is not significantly hypermetabolic to the mediastinal pool. Example at on the order of a S.U.V. max of 2.2 including on 90/4. Similar in size to on the prior. Incidental CT findings: Deferred to recent diagnostic CT. ABDOMEN/PELVIS: No abdominopelvic parenchymal or nodal hypermetabolism. Incidental CT findings: Abdominal aortic atherosclerosis. Large colonic stool burden. SKELETON: No abnormal marrow activity. Incidental CT findings: none IMPRESSION: 1. The pleural-based right-sided thoracic lesion demonstrates low-level activity, similar to the mediastinal pool. 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 If not already performed, recommend multidisciplinary thoracic oncology consultation. Consider CT follow-up at 6 months. 2.  Aortic Atherosclerosis (ICD10-I70.0). Electronically Signed   By: Abigail Miyamoto M.D.   On: 03/05/2021 14:46   US Venous Img Lower Unilateral Left (DVT)  Result Date: 02/20/2021 CLINICAL DATA:  Left leg swelling since March 2022. Positive D-dimer. EXAM: LEFT LOWER EXTREMITY VENOUS DOPPLER ULTRASOUND TECHNIQUE: Gray-scale sonography with compression, as well as color and duplex ultrasound, were performed to evaluate the deep venous system(s) from the level of the common femoral vein through the popliteal and proximal calf veins. COMPARISON:  CTA PE, concurrent FINDINGS: VENOUS Partially-occluding, echogenic filling defect and noncompressibility within the left common femoral vein, and superficial femoral vein spanning the proximal, mid and  distal segment. The thrombus extends to the superior segment of the left popliteal vein. Limited views of the contralateral common femoral vein are unremarkable. OTHER No evidence of superficial thrombophlebitis or abnormal fluid collection. Limitations: none IMPRESSION: Acute left femoropopliteal DVT, as above. These results will be called to the ordering clinician or representative by the Radiologist Assistant, and communication documented in the PACS or [employer]. Electronically Signed   By: Michaelle Birks M.D.   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   PLAN: I had a lengthy discussion with the patient today about her current condition and possible treatment options. I personally and independently reviewed the scan and discussed the result and showed the images to the patient today. I recommended for the patient to see cardiothoracic surgery for consideration of surgical resection. I will see her back for follow-up visit after the surgical resection to discuss any additional treatment if needed at that time but this is unlikely if she has complete resection. The patient agreed to the current plan. For the history of deep venous thrombosis, she will continue her current treatment with Eliquis. She was advised to call immediately if she has any other concerning symptoms in the interval.  The patient voices understanding of current disease status and treatment options and is in agreement with the current care plan.  All questions were answered. The patient knows to  call the clinic with any problems, questions or concerns. We can certainly see the patient much sooner if necessary.  Thank you so much for allowing me to participate in the care of [facility]. I will continue to follow up the patient with you and assist in her care.  The total time  spent in the appointment was 60 minutes.  Disclaimer: This note was dictated with voice recognition software. Similar sounding words can inadvertently be transcribed and may not be corrected upon review.   Eilleen Kempf March 18, 2021, 2:43 PM

## 2021-03-18 NOTE — Telephone Encounter (Signed)
I called [name] to make sure she was aware of her appt with Dr. Julien Nordmann today.  She states she is aware and will be here.

## 2021-03-20 ENCOUNTER — Telehealth: Payer: Self-pay | Admitting: *Deleted

## 2021-03-20 NOTE — Telephone Encounter (Signed)
I followed up on [name]'s recent visit note from Dr. Julien Nordmann. He has referred her to see thoracic surgery. She has an appt on 9/26 with Dr. Roxan Hockey. I called to see if she had any questions about appt or location.  She states she does not have any questions and she is going to be calling Dr. Leonarda Salon office to confirm appt.

## 2021-03-25 ENCOUNTER — Telehealth: Payer: Self-pay | Admitting: Cardiology

## 2021-03-25 NOTE — Telephone Encounter (Signed)
Pt wants to discuss report from oncologist from last Monday. Please give her a call at [phone].

## 2021-04-01 ENCOUNTER — Encounter: Payer: BC Managed Care – PPO | Admitting: Thoracic Surgery (Cardiothoracic Vascular Surgery)

## 2021-04-01 NOTE — Telephone Encounter (Signed)
Spoke to her next week.

## 2021-04-04 ENCOUNTER — Encounter: Payer: Self-pay | Admitting: Thoracic Surgery (Cardiothoracic Vascular Surgery)

## 2021-04-04 ENCOUNTER — Other Ambulatory Visit: Payer: Self-pay

## 2021-04-04 ENCOUNTER — Institutional Professional Consult (permissible substitution): Payer: BC Managed Care – PPO | Admitting: Thoracic Surgery (Cardiothoracic Vascular Surgery)

## 2021-04-04 VITALS — BP 156/86 | HR 76 | Resp 20 | Ht 67.0 in | Wt 150.0 lb

## 2021-04-04 DIAGNOSIS — R918 Other nonspecific abnormal finding of lung field: Secondary | ICD-10-CM

## 2021-04-04 DIAGNOSIS — N921 Excessive and frequent menstruation with irregular cycle: Secondary | ICD-10-CM | POA: Insufficient documentation

## 2021-04-04 NOTE — Progress Notes (Signed)
PCP is Kristine Lass, MD Referring Provider is Kristine Bears, MD  Chief Complaint  Patient presents with   Lung Mass    Surgical consult, PET Scan 03/05/21, CTA Chest 8?16/22    HPI: [name] sent a consultation regarding a pleural tumor.  Kristine Kirby is a 45 year old woman recently diagnosed with hypertension, acute DVT in the left leg, and a pleural-based mass in the right chest.  She says she has been having swelling in her legs on and off going back to March.  In July she had a couple of days where she felt her heart was racing and she had shortness of breath.  In August her leg swelling was worsening and she had a duplex of her legs.  She was diagnosed with acute femoral-popliteal DVT on the left.  She had a CT of the chest to rule out pulmonary embolus.  There was no evidence of PE.  There was a 3.1 x 2.7 x 3.7 cm pleural-based mass at the right base posteriorly.  She was referred to Dr. Julien Kirby.  A PET CT of the chest showed the mass had low-level metabolic activity.  She is now having more episodes of shortness of breath and heart racing since the week of 4 July.  She still has swelling in her left leg greater than right.   Past Medical History:  Diagnosis Date   Dysmenorrhea    Hypertension    Numbness and tingling in right hand     Past Surgical History:  Procedure Laterality Date   MOUTH SURGERY     None      Family History  Problem Relation Age of Onset   Hypertension Mother    Diabetes Mother     Social History Social History   Tobacco Use   Smoking status: Never   Smokeless tobacco: Never  Vaping Use   Vaping Use: Never used  Substance Use Topics   Alcohol use: No    Comment: occ   Drug use: No    Current Outpatient Medications  Medication Sig Dispense Refill   apixaban (ELIQUIS) 5 MG TABS tablet Take 1 tablet (5 mg total) by mouth 2 (two) times daily. 60 tablet 0   hydrochlorothiazide (MICROZIDE) 12.5 MG capsule Take 12.5 mg by mouth daily.      norethindrone (MICRONOR) 0.35 MG tablet Take 1 tablet by mouth daily.     valACYclovir (VALTREX) 1000 MG tablet Take 1,000 mg by mouth 2 (two) times daily as needed.     Vitamin D, Ergocalciferol, (DRISDOL) 50000 units CAPS capsule Take 2,000 Units by mouth every 7 (seven) days. (Patient not taking: Reported on 04/04/2021)     No current facility-administered medications for this visit.    Allergies  Allergen Reactions   Codeine Nausea And Vomiting    Severe nausea and vomiting    Review of Systems  Constitutional:  Negative for activity change and unexpected weight change.  HENT:  Negative for trouble swallowing and voice change.   Respiratory:  Positive for shortness of breath (For couple days in July).   Cardiovascular:  Positive for palpitations (For couple of days in July) and leg swelling (Left greater than right).  Gastrointestinal:  Negative for abdominal distention and abdominal pain.  Psychiatric/Behavioral:         A lot of stress at work  All other systems reviewed and are negative.  BP (!) 156/86 (BP Location: Left Arm, Patient Position: Sitting, Cuff Size: Normal)   Pulse 76  Resp 20   Ht 5\' 7"  (1.702 m)   Wt 150 lb (68 kg)   SpO2 100% Comment: RA  BMI 23.49 kg/m  Physical Exam Vitals reviewed.  Constitutional:      General: She is not in acute distress.    Appearance: Normal appearance.  HENT:     Head: Normocephalic and atraumatic.  Eyes:     Extraocular Movements: Extraocular movements intact.  Cardiovascular:     Rate and Rhythm: Normal rate and regular rhythm.     Heart sounds: Normal heart sounds. No murmur heard. Pulmonary:     Effort: Pulmonary effort is normal. No respiratory distress.     Breath sounds: Normal breath sounds. No wheezing or rales.  Abdominal:     General: There is no distension.     Palpations: Abdomen is soft.  Musculoskeletal:     Cervical back: Neck supple.     Left lower leg: Edema (2+ nonpitting) present.   Lymphadenopathy:     Cervical: No cervical adenopathy.  Skin:    General: Skin is warm and dry.  Neurological:     General: No focal deficit present.     Mental Status: She is alert and oriented to person, place, and time.     Cranial Nerves: No cranial nerve deficit.     Motor: No weakness.     Diagnostic Tests: CT ANGIOGRAPHY CHEST WITH CONTRAST   TECHNIQUE: Multidetector CT imaging of the chest was performed using the standard protocol during bolus administration of intravenous contrast. Multiplanar CT image reconstructions and MIPs were obtained to evaluate the vascular anatomy.   CONTRAST:  4mL ISOVUE-370 IOPAMIDOL (ISOVUE-370) INJECTION 76%   COMPARISON:  None.   FINDINGS: Cardiovascular: Pulmonary arteries appear patent and normal in caliber. Negative for significant acute pulmonary embolus or filling defect by CTA.   Intact thoracic aorta. Negative for aneurysm or dissection. Patent 3 vessel arch anatomy.   No mediastinal hemorrhage or hematoma. Normal heart size. No pericardial effusion. Central venous structures appear patent. No veno-occlusive process.   Mediastinum/Nodes: No enlarged mediastinal, hilar, or axillary lymph nodes. Thyroid gland, trachea, and esophagus demonstrate no significant findings.   Lungs/Pleura: No acute airspace process, collapse or consolidation. Negative for edema or central disease. Trachea central airways are patent.   Posterior right lower chest oval pleural-based well-circumscribed lesion measuring 3.1 x 2.1 x 3.7 cm. Lesion is relatively homogeneous, smoothly marginated, and hypodense. Mass remains indeterminate but a solitary fibrous tumor of the pleura could have this appearance.   Upper Abdomen: No acute abnormality.   Musculoskeletal: No chest wall abnormality. No acute or significant osseous findings.   Review of the MIP images confirms the above findings.   IMPRESSION: Negative for significant acute  pulmonary embolus by CTA.   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 Recommend further evaluation with PET-CT.   These results will be called to the ordering clinician or representative by the Radiologist Assistant, and communication documented in the PACS or [employer].     Electronically Signed   By: Jerilynn Mages.  Shick M.D.   On: 02/19/2021 16:06 NUCLEAR MEDICINE PET SKULL BASE TO THIGH   TECHNIQUE: 7.9 mCi F-18 FDG was injected intravenously. Full-ring PET imaging was performed from the skull base to thigh after the radiotracer. CT data was obtained and used for attenuation correction and anatomic localization.   Fasting blood glucose: 101 mg/dl   COMPARISON:  Chest CT  02/19/2021.   FINDINGS: Mediastinal blood pool activity: SUV max 2.1   Liver activity: SUV max NA   NECK: No areas of abnormal hypermetabolism.   Incidental CT findings: No cervical adenopathy.   CHEST: No thoracic nodal hypermetabolism. The pleural-based right lower lobe lesion measures 3.0 X 1.9 cm and is not significantly hypermetabolic to the mediastinal pool. Example at on the order of a S.U.V. max of 2.2 including on 90/4.   Similar in size to on the prior.   Incidental CT findings: Deferred to recent diagnostic CT.   ABDOMEN/PELVIS: No abdominopelvic parenchymal or nodal hypermetabolism.   Incidental CT findings: Abdominal aortic atherosclerosis. Large colonic stool burden.   SKELETON: No abnormal marrow activity.   Incidental CT findings: none   IMPRESSION: 1. The pleural-based right-sided thoracic lesion demonstrates low-level activity, similar to the mediastinal pool. 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 If not already performed, recommend multidisciplinary thoracic oncology consultation. Consider CT follow-up at 6 months. 2.  Aortic  Atherosclerosis (ICD10-I70.0).     Electronically Signed   By: Abigail Miyamoto M.D.   On: 03/05/2021 14:46 I personally reviewed the CT and PET/CT images.  There is a 3 x 2.7 x 3.7 cm mass in the right lower chest.  There is broad-based contact with the diaphragm and the chest wall.  Low-level activity on PET.  Impression: Kristine Kirby is a 6 year old woman recently diagnosed with acute DVT in the left femoral-popliteal system and a pleural-based tumor in the right chest.  The right pleural mass has low-level activity on PET/CT.  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  I reviewed the images with Kristine Kirby and we discussed the treatment of solitary fibrous tumors of the pleura.  She understands that treatment is surgical resection.  Based on her scans is hard to tell if this is arising from the chest wall or the diaphragm.  It is possible she might need resection of a portion of the diaphragm, although that will need to be determined at the time of surgery.  I described the postsurgical procedure to her.  We will plan to do this with a robotic approach.  She will need a chest tube afterwards, likely for a day or 2.  She understands we will need to use general anesthesia.  I informed her of the indications, risk, benefits, and alternatives.  She understands the risks include, but not limited to death, MI, DVT, PE, bleeding, possible need for transfusion, infection, air leak, as well as possibility of other unforeseeable complications.  The issue with her is timing of surgery.  She is about 6 weeks out from being diagnosed with acute femoral-popliteal DVT.  Given the relatively benign appearance of the lesion, there is no urgent indication for surgical resection.  We would plan to wait 3 months from the initiation of anticoagulation before proceeding with surgery.  She is scheduled to have a repeat duplex on November 30.  I will see her back after that and we can  discuss timing of surgery.  Plan: Return first week of December to discuss timing of surgery.  Melrose Nakayama, MD [facility] [phone]

## 2021-04-06 ENCOUNTER — Other Ambulatory Visit: Payer: Self-pay

## 2021-04-06 ENCOUNTER — Encounter (HOSPITAL_COMMUNITY): Payer: Self-pay

## 2021-04-06 ENCOUNTER — Emergency Department (HOSPITAL_COMMUNITY)
Admission: EM | Admit: 2021-04-06 | Discharge: 2021-04-06 | Disposition: A | Discharge status: Home / Self Care | Payer: BC Managed Care – PPO | Attending: Emergency Medicine | Admitting: Emergency Medicine

## 2021-04-06 DIAGNOSIS — Z79899 Other long term (current) drug therapy: Secondary | ICD-10-CM | POA: Insufficient documentation

## 2021-04-06 DIAGNOSIS — R600 Localized edema: Secondary | ICD-10-CM | POA: Insufficient documentation

## 2021-04-06 DIAGNOSIS — I10 Essential (primary) hypertension: Secondary | ICD-10-CM | POA: Insufficient documentation

## 2021-04-06 DIAGNOSIS — Z7901 Long term (current) use of anticoagulants: Secondary | ICD-10-CM | POA: Insufficient documentation

## 2021-04-06 DIAGNOSIS — R030 Elevated blood-pressure reading, without diagnosis of hypertension: Secondary | ICD-10-CM | POA: Diagnosis present

## 2021-04-06 NOTE — ED Triage Notes (Signed)
Pt arrived via POV, c/o HTN, getting progressively higher the last couple days. Hx of HTN, has been taking meds as prescribed , no recent changes.

## 2021-04-06 NOTE — ED Notes (Signed)
Pt reporting elevated BP starting in July. And, on Aug 12 cardiologist found blood clot in left quadricept. July 15 pt began taking hydrochloride 12.5 mg and eliquis 5 mg BID. Pt concerned about blood pressure. BP over the last three days:   09/29 at 159/89  09/30 at 163/87 10/01 at 7:15 AM 219/83  10/01 at 8:35 AM 194/108

## 2021-04-06 NOTE — ED Provider Notes (Signed)
[facility] DEPT Provider Note   CSN: [id number] Arrival date & time: 04/06/21  0102     History Chief Complaint  Patient presents with   Hypertension    Kristine Kirby is a 45 y.o. female.  Patient has a past medical history of recent DVT on Eliquis, recent lung mass found on CT scan suspected to be stage I lung cancer.  She presents to the emergency department with 1 episode of high blood pressure at home.  She states that over the past few days her blood pressure has been slowly increasing.  Today she measured her blood pressure and it was 219/unknown number  She did measure it again it was 194/109.  At the time she had no symptoms such as headache, vision changes, chest pain, shortness of breath, syncope, dizziness, lightheadedness.  She states that she takes hydrochlorothiazide 12.5 mg daily and has been taking it every day since she was prescribed this.  She has not had any other readings of blood pressure at this level since she was diagnosed with hypertension.  She does endorse increased stress over the last few days.  She was recently diagnosed with stage I lung cancer and a DVT which she was placed on Eliquis for.  She has an appointment with her PCP that is not until October 10.   Hypertension Pertinent negatives include no chest pain, no abdominal pain, no headaches and no shortness of breath.      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     Family History  Problem Relation Age of Onset   Hypertension Mother    Diabetes  Mother     Social History   Tobacco Use   Smoking status: Never   Smokeless tobacco: Never  Vaping Use   Vaping Use: Never used  Substance Use Topics   Alcohol use: No    Comment: occ   Drug use: No    Home Medications Prior to Admission medications   Medication Sig Start Date End Date Taking? Authorizing Provider  desogestrel-ethinyl estradiol (CYCLESSA) 0.1/0.125/0.15 -0.025 MG tablet Take 1 tablet by mouth daily.   Yes [provider]  apixaban (ELIQUIS) 5 MG TABS tablet Take 1 tablet (5 mg total) by mouth 2 (two) times daily. 03/12/21   Tolia, Sunit, DO  hydrochlorothiazide (MICROZIDE) 12.5 MG capsule Take 12.5 mg by mouth daily. 02/13/21   [provider]  norethindrone (MICRONOR) 0.35 MG tablet Take 1 tablet by mouth daily. 02/25/21   [provider]  valACYclovir (VALTREX) 1000 MG tablet Take 1,000 mg by mouth 2 (two) times daily as needed. 11/25/20   [provider]    Allergies    Codeine  Review of Systems   Review of Systems  Constitutional:  Negative for chills and fever.  HENT:  Negative for congestion and rhinorrhea.   Eyes:  Negative for visual disturbance.  Respiratory:  Negative for cough, chest tightness and shortness of breath.  Cardiovascular:  Negative for chest pain, palpitations and leg swelling.  Gastrointestinal:  Negative for abdominal pain, constipation, diarrhea, nausea and vomiting.  Genitourinary:  Negative for difficulty urinating.  Musculoskeletal:  Negative for back pain.  Skin:  Negative for rash and wound.  Neurological:  Negative for dizziness, syncope, weakness, light-headedness and headaches.  Psychiatric/Behavioral:  Negative for agitation and confusion. The patient is nervous/anxious.   All other systems reviewed and are negative.  Physical Exam Updated Vital Signs BP 120/88   Pulse 83   Temp 97.6 F (36.4 C) (Oral)   Resp 18   Ht 5' 5.5" (1.664 m)   Wt 68.5 kg   SpO2 100%   BMI 24.75 kg/m    Physical Exam Vitals and nursing note reviewed.  Constitutional:      General: She is not in acute distress.    Appearance: Normal appearance. She is not ill-appearing, toxic-appearing or diaphoretic.  HENT:     Head: Normocephalic and atraumatic.     Nose: No nasal deformity.     Mouth/Throat:     Lips: Pink. No lesions.     Mouth: No injury, lacerations, oral lesions or angioedema.     Pharynx: Uvula midline. No pharyngeal swelling or uvula swelling.  Eyes:     General: Gaze aligned appropriately. No scleral icterus.       Right eye: No discharge.        Left eye: No discharge.     Conjunctiva/sclera: Conjunctivae normal.     Right eye: Right conjunctiva is not injected. No exudate or hemorrhage.    Left eye: Left conjunctiva is not injected. No exudate or hemorrhage.    Pupils: Pupils are equal, round, and reactive to light.  Cardiovascular:     Rate and Rhythm: Normal rate and regular rhythm.     Pulses: Normal pulses.          Radial pulses are 2+ on the right side and 2+ on the left side.       Dorsalis pedis pulses are 2+ on the right side and 2+ on the left side.     Heart sounds: Normal heart sounds, S1 normal and S2 normal. Heart sounds not distant. No murmur heard.   No friction rub. No gallop. No S3 or S4 sounds.  Pulmonary:     Effort: Pulmonary effort is normal. No accessory muscle usage or respiratory distress.     Breath sounds: Normal breath sounds. No stridor. No wheezing, rhonchi or rales.  Chest:     Chest wall: No tenderness.  Abdominal:     General: Abdomen is flat. Bowel sounds are normal. There is no distension.     Palpations: Abdomen is soft. There is no mass or pulsatile mass.     Tenderness: There is no abdominal tenderness. There is no guarding or rebound.  Musculoskeletal:     Right lower leg: No edema.     Left lower leg: Edema present.  Skin:    General: Skin is warm and dry.     Coloration: Skin is not jaundiced or pale.     Findings: No  bruising, erythema, lesion or rash.  Neurological:     General: No focal deficit present.     Mental Status: She is alert and oriented to person, place, and time.     GCS: GCS eye subscore is 4. GCS verbal subscore is 5. GCS motor subscore is 6.  Psychiatric:        Mood and Affect:  Mood normal.        Behavior: Behavior normal. Behavior is cooperative.    ED Results / Procedures / Treatments   Labs (all labs ordered are listed, but only abnormal results are displayed) Labs Reviewed - No data to display  EKG None  Radiology No results found.  Procedures Procedures   Medications Ordered in ED Medications - No data to display  ED Course  I have reviewed the triage vital signs and the nursing notes.  Pertinent labs & imaging results that were available during my care of the patient were reviewed by me and considered in my medical decision making (see chart for details).    MDM Rules/Calculators/A&P                         Is a well-appearing 45 year old female who presents to the emergency department with complaints of 1 reading of elevated blood pressure.  On presentation her blood pressure is 151/91.  She is otherwise hemodynamically stable.  She is oxygenating well on room air.  She is afebrile.  Her exam is benign.  She does have some left lower extremity edema.  She says this is improved from when she was diagnosed with her DVT 3 weeks ago.  Patient has no evidence of endorgan damage.  When I was in the room, her blood pressure was 120/86.  I discussed this with the patient and educated her on proper techniques of checking her blood pressure at home.  I explained that she is not having any signs of end organ damage and that her blood pressure is normal while in the emergency department.  I told her that I am comfortable with her returning home without any changes to her medication.  She needs to follow-up with her primary care on October 10.  If she develops continued high  blood pressure, she should reach out to them to see if they want to make any changes to her medication.  If she develops high blood pressure like she did today, she should return to the emergency department especially if she has symptoms associated with it such as blurry vision, headache, or chest pain.    Final Clinical Impression(s) / ED Diagnoses Final diagnoses:  Primary hypertension    Rx / DC Orders ED Discharge Orders     None        Adolphus Birchwood, PA-C 04/06/21 1329    Charlesetta Shanks, MD 04/24/21 1712

## 2021-04-06 NOTE — Discharge Instructions (Addendum)
You have been seen in the Emergency Department for an elevated BP reading that you had this morning. We have rechecked your BP in the emergency department and it is normal. Please follow up with your PCP for further changes in your medications. Please return to the emergency department if you develop chest pain, shortness of breath, vision changes, seizure, or confusion.

## 2021-04-13 ENCOUNTER — Other Ambulatory Visit: Payer: Self-pay | Admitting: Cardiology

## 2021-05-13 ENCOUNTER — Telehealth: Payer: Self-pay

## 2021-05-13 ENCOUNTER — Other Ambulatory Visit: Payer: Self-pay

## 2021-05-13 ENCOUNTER — Telehealth: Payer: Self-pay | Admitting: Cardiology

## 2021-05-13 DIAGNOSIS — I82412 Acute embolism and thrombosis of left femoral vein: Secondary | ICD-10-CM

## 2021-05-13 NOTE — Telephone Encounter (Signed)
Patient wanted to let us know that she just finished getting her blood work done.

## 2021-05-13 NOTE — Telephone Encounter (Signed)
Patient is aware of message she voiced understanding

## 2021-05-13 NOTE — Telephone Encounter (Signed)
Monitor it for now and have her check her weights daily and write them down.  If they dont resolve move up her appt.

## 2021-05-13 NOTE — Telephone Encounter (Signed)
Aware patient had labs done and spoke to patient

## 2021-05-13 NOTE — Telephone Encounter (Signed)
Is she still taking Eliquis? Please have her get labs done - order BMP and Mg level.  IF she is on Eliquis have her get some compression stockings and elevate her legs.  IF sh is off of Eliquis will need to order US of the legs.  Let me know.

## 2021-05-13 NOTE — Telephone Encounter (Signed)
Patient is on Eliquis and she has been wearing compression stockings and elevating her legs she said that this swollen "came out of the nowhere from last night to today" Patient was made aware of labs and she is willing to go.

## 2021-05-14 ENCOUNTER — Other Ambulatory Visit: Payer: Self-pay | Admitting: Cardiology

## 2021-05-14 LAB — BASIC METABOLIC PANEL
BUN/Creatinine Ratio: 19 (ref 9–23)
BUN: 19 mg/dL (ref 6–24)
CO2: 28 mmol/L (ref 20–29)
Calcium: 8.5 mg/dL — ABNORMAL LOW (ref 8.7–10.2)
Chloride: 107 mmol/L — ABNORMAL HIGH (ref 96–106)
Creatinine, Ser: 0.99 mg/dL (ref 0.57–1.00)
Glucose: 92 mg/dL (ref 70–99)
Potassium: 4.4 mmol/L (ref 3.5–5.2)
Sodium: 141 mmol/L (ref 134–144)
eGFR: 71 mL/min/{1.73_m2} (ref >59)

## 2021-05-14 LAB — MAGNESIUM: Magnesium: 1.9 mg/dL (ref 1.6–2.3)

## 2021-05-15 ENCOUNTER — Telehealth: Payer: Self-pay

## 2021-05-17 NOTE — Telephone Encounter (Signed)
Labs reviewed.  Renal function is stable.  How is the LE swelling?

## 2021-05-17 NOTE — Telephone Encounter (Signed)
Spoke to patient she is aware of labs and her LE swelling has gone down.

## 2021-06-05 ENCOUNTER — Ambulatory Visit
Admission: RE | Admit: 2021-06-05 | Discharge: 2021-06-05 | Disposition: A | Discharge status: Home / Self Care | Payer: BC Managed Care – PPO | Source: Ambulatory Visit | Attending: Cardiology | Admitting: Cardiology

## 2021-06-05 DIAGNOSIS — I82412 Acute embolism and thrombosis of left femoral vein: Secondary | ICD-10-CM

## 2021-06-06 ENCOUNTER — Other Ambulatory Visit: Payer: Self-pay | Admitting: Thoracic Surgery (Cardiothoracic Vascular Surgery)

## 2021-06-06 DIAGNOSIS — D492 Neoplasm of unspecified behavior of bone, soft tissue, and skin: Secondary | ICD-10-CM

## 2021-06-11 ENCOUNTER — Other Ambulatory Visit: Payer: Self-pay

## 2021-06-11 ENCOUNTER — Other Ambulatory Visit: Payer: Self-pay | Admitting: *Deleted

## 2021-06-11 ENCOUNTER — Ambulatory Visit: Payer: BC Managed Care – PPO | Admitting: Thoracic Surgery (Cardiothoracic Vascular Surgery)

## 2021-06-11 ENCOUNTER — Encounter: Payer: Self-pay | Admitting: Thoracic Surgery (Cardiothoracic Vascular Surgery)

## 2021-06-11 ENCOUNTER — Encounter: Payer: Self-pay | Admitting: *Deleted

## 2021-06-11 ENCOUNTER — Ambulatory Visit
Admission: RE | Admit: 2021-06-11 | Discharge: 2021-06-11 | Disposition: A | Discharge status: Home / Self Care | Payer: BC Managed Care – PPO | Source: Ambulatory Visit | Attending: Thoracic Surgery (Cardiothoracic Vascular Surgery) | Admitting: Thoracic Surgery (Cardiothoracic Vascular Surgery)

## 2021-06-11 VITALS — BP 114/79 | HR 63 | Resp 20 | Ht 65.5 in | Wt 150.0 lb

## 2021-06-11 DIAGNOSIS — I10 Essential (primary) hypertension: Secondary | ICD-10-CM | POA: Insufficient documentation

## 2021-06-11 DIAGNOSIS — I82409 Acute embolism and thrombosis of unspecified deep veins of unspecified lower extremity: Secondary | ICD-10-CM | POA: Insufficient documentation

## 2021-06-11 DIAGNOSIS — R918 Other nonspecific abnormal finding of lung field: Secondary | ICD-10-CM | POA: Diagnosis not present

## 2021-06-11 DIAGNOSIS — D492 Neoplasm of unspecified behavior of bone, soft tissue, and skin: Secondary | ICD-10-CM

## 2021-06-11 DIAGNOSIS — I700 Atherosclerosis of aorta: Secondary | ICD-10-CM | POA: Insufficient documentation

## 2021-06-11 DIAGNOSIS — E7800 Pure hypercholesterolemia, unspecified: Secondary | ICD-10-CM | POA: Insufficient documentation

## 2021-06-11 DIAGNOSIS — E559 Vitamin D deficiency, unspecified: Secondary | ICD-10-CM | POA: Insufficient documentation

## 2021-06-11 NOTE — Progress Notes (Signed)
[address]       [place] [address]             [phone]     HPI: Ms. Husser returns for a follow-up regarding her pleural-based tumor.    Maleka Contino is a 45 year old woman recently diagnosed with a DVT in her left leg.  She been having swelling for several months and then got worse.  Diagnosis was made with ultrasound.  She had a CT of the chest to rule out pulmonary embolus.  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  On PET/CT there was low-grade metabolic activity.  I saw her in September.  She was about 6 weeks out from initiating anticoagulation with apixaban.  We discussed surgical removal but wanted to wait until she had been on treatment for at least 3 months.  She is feeling well.  She is not having any issues with shortness of breath.  She does have some occasional swelling in her left leg.  She had a repeat duplex which showed no clot in the common femoral.  There was incomplete resolution of the thrombus in her left femoral-popliteal veins.  Past Medical History:  Diagnosis Date   DVT of deep femoral vein, left (HCC)    Dysmenorrhea    Hypertension    Numbness and tingling in right hand    Solitary fibrous tumor    Pleura     Current Outpatient Medications  Medication Sig Dispense Refill   desogestrel-ethinyl estradiol (CYCLESSA) 0.1/0.125/0.15 -0.025 MG tablet Take 1 tablet by mouth daily.     ELIQUIS 5 MG TABS tablet TAKE 1 TABLET BY MOUTH TWICE A DAY 60 tablet 0   hydrochlorothiazide (MICROZIDE) 12.5 MG capsule Take 12.5 mg by mouth daily.     norethindrone (MICRONOR) 0.35 MG tablet Take 1 tablet by mouth daily.     valACYclovir (VALTREX) 1000 MG tablet Take 1,000 mg by mouth 2 (two) times daily as needed.     No current facility-administered medications for this visit.    Physical Exam BP 114/79   Pulse 63   Resp 20   Ht 5' 5.5" (1.664 m)   Wt 150 lb (68 kg)   LMP 05/15/2021    SpO2 100% Comment: RA  BMI 24.43 kg/m  45 year old woman in no acute distress Alert and oriented x3 with no focal deficits No cervical supraclavicular adenopathy Lungs clear bilaterally Cardiac regular rate and rhythm, no rub or murmur Abdomen soft nontender Extremities no clubbing, cyanosis.  No peripheral edema  Diagnostic Tests: I again reviewed her CT and PET/CT.  Also reviewed chest x-ray from today.  Right pleural-based mass at diaphragm posteriorly.  Impression: Avin Upperman is a 45 year old woman with a tumor in the right chest.  This mass is mildly hypermetabolic on PET/CT.  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  Surgical resection is indicated for the great chest mass.  We will plan to do this with a robotic approach.  I discussed the general nature of the procedure including the need for general anesthesia, the incisions to be used, the use of a drainage tube postoperatively, the expected hospital stay, and the overall recovery.  I informed her of the indications, risk, benefits, and alternatives.  She understands the risks include, but not limited to death, MI, DVT, PE, bleeding, possible need for transfusion, infection, prolonged air leak,  as well as possibility of other unforeseeable complications.  She is now over 3 months out from starting anticoagulation.  Her recent duplex showed improvement but not complete resolution of her femoral-popliteal thrombus on the left.  I do feel her risk is sufficiently low at this time that we can proceed with surgery.  We will need to hold her Eliquis for 2 days prior to surgery, and then will resume as soon as it safe afterwards..  She wishes to have the surgery done the week of January 9.  Plan: Robotic right VATS for resection of pleural tumor on Wednesday, 07/17/2021 Hold apixaban for 2 days prior to surgery   Melrose Nakayama, MD [facility] [phone]

## 2021-06-12 NOTE — Progress Notes (Signed)
Called pt, no answer. Left vm requesting call back?

## 2021-06-13 NOTE — Progress Notes (Signed)
Attempted to call pt, no answer. Left vm requesting call back.

## 2021-06-13 NOTE — Progress Notes (Signed)
Spoke with patient, she confirmed the appointment on Dec 21st and she also wanted to let you know that her surgery has been scheduled for Jan 11th, 2023.

## 2021-06-17 ENCOUNTER — Encounter: Payer: Self-pay | Admitting: *Deleted

## 2021-06-22 ENCOUNTER — Other Ambulatory Visit: Payer: Self-pay | Admitting: Cardiology

## 2021-06-26 ENCOUNTER — Encounter: Payer: Self-pay | Admitting: Cardiology

## 2021-06-26 ENCOUNTER — Telehealth: Payer: Self-pay

## 2021-06-26 ENCOUNTER — Ambulatory Visit: Payer: BC Managed Care – PPO | Admitting: Cardiology

## 2021-06-26 ENCOUNTER — Other Ambulatory Visit: Payer: Self-pay

## 2021-06-26 VITALS — BP 138/82 | HR 59 | Temp 98.0°F | Resp 17 | Ht 65.5 in | Wt 163.4 lb

## 2021-06-26 DIAGNOSIS — I10 Essential (primary) hypertension: Secondary | ICD-10-CM

## 2021-06-26 DIAGNOSIS — I82512 Chronic embolism and thrombosis of left femoral vein: Secondary | ICD-10-CM

## 2021-06-26 DIAGNOSIS — R918 Other nonspecific abnormal finding of lung field: Secondary | ICD-10-CM

## 2021-06-26 DIAGNOSIS — Z7901 Long term (current) use of anticoagulants: Secondary | ICD-10-CM

## 2021-06-26 NOTE — Telephone Encounter (Signed)
FMLA form completed and mail to home address per patient. LOA beginning 07/17/21 through 09/16/21

## 2021-06-26 NOTE — Progress Notes (Signed)
Date:  06/26/2021   ID:  [name], DOB 12-21-1975, MRN [id number]  PCP:  Kathyrn Lass, MD  Cardiologist:  Rex Kras, DO, [facility]  (established care 02/14/2021)  Date: 06/26/21 Last Office Visit: 02/28/2021  Chief Complaint  Patient presents with   DVT   Follow-up    HPI  Kristine Kirby is a 45 y.o. female who presents to the office with a chief complaint of " follow-up for DVT." Patient's past medical history and cardiovascular risk factors include: Hypertension, acute left femoral-popliteal DVT, right lower lobe lesion 3 x 1.9 cm (CT PE protocol 02/2021, PET scan 8/30 2022), aortic atherosclerosis (PET scan 02/2021).  She is referred to the office at the request of Kathyrn Lass, MD for evaluation of chest pain.  Patient was initially referred to the office for evaluation of chest pain and her symptoms have resolved since initiation of hydrochlorothiazide.  During her prior office visits patient was complaining of lower extremity swelling left worse than the right.  Clinical suspicion for DVT led to lower extremity venous duplex which confirmed the presence of acute left femoral-popliteal DVT.  Patient was started on Eliquis.  She also underwent CT PE protocol to rule out the presence of pulmonary embolism given the elevated D-dimers.  Patient was noted to have a right-sided pleural-based well-circumscribed mass.  She has gone into her additional testing currently under the care of medical oncology and cardiothoracic surgery and plans to have surgery in July 17, 2021.  Patient is tolerating Eliquis well without any side effects or intolerances.  Repeat lower extremity venous duplex as of November 2022 notes some improvement in overall clot burden but continues to have presence of thrombosis in the left femoral-popliteal veins.  Please refer to the official report.  Otherwise patient denies any chest pain at rest or with effort related activities.  She denies any shortness of  breath, orthopnea, paroxysmal nocturnal dyspnea or lower extremity swelling.  FUNCTIONAL STATUS: 3-4 walking for about 3 miles each time.    ALLERGIES: Allergies  Allergen Reactions   Codeine Nausea And Vomiting    Severe nausea and vomiting    MEDICATION LIST PRIOR TO VISIT: Current Meds  Medication Sig   desogestrel-ethinyl estradiol (CYCLESSA) 0.1/0.125/0.15 -0.025 MG tablet Take 1 tablet by mouth daily.   ELIQUIS 5 MG TABS tablet TAKE 1 TABLET BY MOUTH TWICE A DAY   hydrochlorothiazide (HYDRODIURIL) 25 MG tablet Take 25 mg by mouth daily.   norethindrone (MICRONOR) 0.35 MG tablet Take 1 tablet by mouth daily.   valACYclovir (VALTREX) 1000 MG tablet Take 1,000 mg by mouth 2 (two) times daily as needed.     PAST MEDICAL HISTORY: Past Medical History:  Diagnosis Date   DVT of deep femoral vein, left (HCC)    Dysmenorrhea    Hypertension    Numbness and tingling in right hand    Solitary fibrous tumor    Pleura    PAST SURGICAL HISTORY: Past Surgical History:  Procedure Laterality Date   MOUTH SURGERY     None      FAMILY HISTORY: The patient family history includes Diabetes in her mother; Hypertension in her mother.  SOCIAL HISTORY:  The patient  reports that she has never smoked. She has never used smokeless tobacco. She reports that she does not drink alcohol and does not use drugs.  REVIEW OF SYSTEMS: Review of Systems  Constitutional: Negative for chills and fever.  HENT:  Negative for hoarse voice and nosebleeds.   Eyes:  Negative for discharge, double vision and pain.  Cardiovascular:  Positive for leg swelling (improving). Negative for chest pain, claudication, dyspnea on exertion, near-syncope, orthopnea, palpitations, paroxysmal nocturnal dyspnea and syncope.  Respiratory:  Negative for hemoptysis and shortness of breath.   Musculoskeletal:  Negative for muscle cramps and myalgias.  Gastrointestinal:  Negative for abdominal pain, constipation, diarrhea,  hematemesis, hematochezia, melena, nausea and vomiting.  Neurological:  Negative for dizziness and light-headedness.   PHYSICAL EXAM: Vitals with BMI 06/26/2021 06/11/2021 04/06/2021  Height 5' 5.5" 5' 5.5" -  Weight 163 lbs 6 oz 150 lbs -  BMI 09.23 30.07 -  Systolic 622 633 354  Diastolic 82 79 88  Pulse 59 63 83    CONSTITUTIONAL: Well-developed and well-nourished. No acute distress.  SKIN: Skin is warm and dry. No rash noted. No cyanosis. No pallor. No jaundice HEAD: Normocephalic and atraumatic.  EYES: No scleral icterus MOUTH/THROAT: Moist oral membranes.  NECK: No JVD present. No thyromegaly noted. No carotid bruits  LYMPHATIC: No visible cervical adenopathy.  CHEST Normal respiratory effort. No intercostal retractions  LUNGS: Clear to auscultation bilaterally.  No stridor. No wheezes. No rales.  CARDIOVASCULAR: Regular, positive S1-S2, no murmurs rubs or gallops appreciated. ABDOMINAL: Nonobese, soft, nontender, nondistended, positive bowel sounds in all 4 quadrants no apparent ascites.  EXTREMITIES: Bilateral  trace peripheral edema.  Warm to touch bilaterally.  2+ PT pulses bilaterally.  Compression stockings present. HEMATOLOGIC: No significant bruising NEUROLOGIC: Oriented to person, place, and time. Nonfocal. Normal muscle tone.  PSYCHIATRIC: Normal mood and affect. Normal behavior. Cooperative  CARDIAC DATABASE: EKG: 02/12/2021: Sinus bradycardia, 54 bpm, consider old anteroseptal infarct, without underlying injury pattern.   Echocardiogram: No results found for this or any previous visit from the past 1095 days.   Stress Testing: No results found for this or any previous visit from the past 1095 days.  Heart Catheterization: None  Lower extremity venous duplex: 02/20/2021: Acute left femoropopliteal DVT  US venous duplex: 06/05/2021 1. Incomplete resolution with residual LEFT femoropopliteal DVT involving the femoral and popliteal veins. 2. The LEFT common  femoral vein is patent on today's evaluation, previously with thrombus.  CT PE protocol: 02/19/2021: Negative for significant acute pulmonary embolus by CTA. 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 Recommend further evaluation with PET-CT.  PET scan: 03/05/2021 1. The pleural-based right-sided thoracic lesion demonstrates low-level activity, similar to the mediastinal pool. 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 If not already performed, recommend multidisciplinary thoracic oncology consultation. Consider CT follow-up at 6 months. 2.  Aortic Atherosclerosis (ICD10-I70.0).  LABORATORY DATA: CBC Latest Ref Rng & Units 03/18/2021 04/23/2018 06/02/2015  WBC 4.0 - 10.5 K/uL 4.9 4.6 4.9  Hemoglobin 12.0 - 15.0 g/dL 11.5(L) 12.0 11.4(L)  Hematocrit 36.0 - 46.0 % 34.9(L) 37.5 34.7(L)  Platelets 150 - 400 K/uL 217 228 182    CMP Latest Ref Rng & Units 05/13/2021 03/18/2021 02/15/2021  Glucose 70 - 99 mg/dL 92 92 82  BUN 6 - 24 mg/dL 19 16 16   Creatinine 0.57 - 1.00 mg/dL 0.99 0.84 0.80  Sodium 134 - 144 mmol/L 141 138 138  Potassium 3.5 - 5.2 mmol/L 4.4 4.0 4.6  Chloride 96 - 106 mmol/L 107(H) 103 103  CO2 20 - 29 mmol/L 28 28 24   Calcium 8.7 - 10.2 mg/dL 8.5(L) 9.2 8.9  Total Protein 6.5 - 8.1 g/dL - 6.8 -  Total Bilirubin 0.3 - 1.2 mg/dL - 0.8 -  Alkaline Phos 38 - 126 U/L - 40 -  AST 15 - 41 U/L - 13(L) -  ALT 0 - 44 U/L - 12 -    Lipid Panel  No results found for: CHOL, TRIG, HDL, CHOLHDL, VLDL, LDLCALC, LDLDIRECT, LABVLDL  No components found for: NTPROBNP No results for input(s): PROBNP in the last 8760 hours. No results for input(s): TSH in the last 8760 hours.  BMP Recent Labs    02/15/21 1501 03/18/21 1402 05/13/21 1213  NA 138 138 141  K 4.6 4.0 4.4  CL 103 103 107*  CO2 24 28 28   GLUCOSE 82 92 92  BUN 16 16 19   CREATININE  0.80 0.84 0.99  CALCIUM 8.9 9.2 8.5*  GFRNONAA  --  >60  --     HEMOGLOBIN A1C No results found for: HGBA1C, MPG  External Labs: Collected: 01/17/2021 provided by PCP Hemoglobin 11 g/dL, hematocrit 38.8%. Sodium 137, potassium 4, chloride 101, bicarb 27 BUN 15, creatinine 0.73 mg/dL. AST 12, ALT 12, alkaline phosphatase 45  IMPRESSION:    ICD-10-CM   1. Chronic deep vein thrombosis (DVT) of femoral vein of left lower extremity (HCC)  I82.512 VAS [place] LOWER EXTREMITY VENOUS (DVT)    2. Long term (current) use of anticoagulants  Z79.01     3. Benign hypertension  I10     4. Lung mass  R91.8        RECOMMENDATIONS: Kristine Kirby is a 45 y.o. female whose past medical history and cardiac risk factors include: Hypertension, acute left femoral-popliteal DVT, right lower lobe lesion 3 x 1.9 cm (CT PE protocol 02/2021, PET scan 8/30 2022), aortic atherosclerosis (PET scan 02/2021).  Left femoral popliteal DVT: Diagnosed 02/20/2021. Repeat study scheduled for November 2022: LEFT common femoral vein is patent. Incomplete resolution with residual LEFT femoropopliteal DVT involving the femoral and popliteal veins. Continue Eliquis for now. Will repeat lower extremity venous duplex in 3 months to reevaluate the burden of femoral-popliteal DVT. Patient does not endorse any evidence of bleeding.  Benign essential hypertension: Office blood pressures better controlled. Hydrochlorothiazide has been increased to 25 mg p.o. daily since last office visit. Currently managed by PCP.  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 Roxan Hockey in January 2023.  FINAL MEDICATION LIST END OF ENCOUNTER: No orders of the defined types were placed in this encounter.    There are no discontinued medications.    Current Outpatient Medications:    desogestrel-ethinyl estradiol (CYCLESSA) 0.1/0.125/0.15 -0.025 MG tablet, Take 1 tablet by mouth daily.,  Disp: , Rfl:    ELIQUIS 5 MG TABS tablet, TAKE 1 TABLET BY MOUTH TWICE A DAY, Disp: 60 tablet, Rfl: 0   hydrochlorothiazide (HYDRODIURIL) 25 MG tablet, Take 25 mg by mouth daily., Disp: , Rfl:    norethindrone (MICRONOR) 0.35 MG tablet, Take 1 tablet by mouth daily., Disp: , Rfl:    valACYclovir (VALTREX) 1000 MG tablet, Take 1,000 mg by mouth 2 (two) times daily as needed., Disp: , Rfl:   Orders Placed This Encounter  Procedures   VAS [place] LOWER EXTREMITY VENOUS (DVT)     There are no Patient Instructions on file for this visit.   --Continue cardiac medications as reconciled in final medication list. --Return in about 3 months (around 09/24/2021), or Post surgery and US Venous Duplex. Or sooner if needed. --Continue follow-up with your primary care physician regarding the  management of your other chronic comorbid conditions.  Patient's questions and concerns were addressed to her satisfaction. She voices understanding of the instructions provided during this encounter.   This note was created using a voice recognition software as a result there may be grammatical errors inadvertently enclosed that do not reflect the nature of this encounter. Every attempt is made to correct such errors.  Rex Kras, [place], [facility]  Pager: [phone] Office: [phone]

## 2021-07-12 ENCOUNTER — Emergency Department (HOSPITAL_BASED_OUTPATIENT_CLINIC_OR_DEPARTMENT_OTHER): Payer: BC Managed Care – PPO

## 2021-07-12 ENCOUNTER — Encounter (HOSPITAL_BASED_OUTPATIENT_CLINIC_OR_DEPARTMENT_OTHER): Payer: Self-pay | Admitting: *Deleted

## 2021-07-12 ENCOUNTER — Other Ambulatory Visit: Payer: Self-pay

## 2021-07-12 ENCOUNTER — Emergency Department (HOSPITAL_BASED_OUTPATIENT_CLINIC_OR_DEPARTMENT_OTHER)
Admission: EM | Admit: 2021-07-12 | Discharge: 2021-07-12 | Disposition: A | Discharge status: Home / Self Care | Payer: BC Managed Care – PPO | Attending: Emergency Medicine | Admitting: Emergency Medicine

## 2021-07-12 DIAGNOSIS — Z7901 Long term (current) use of anticoagulants: Secondary | ICD-10-CM | POA: Insufficient documentation

## 2021-07-12 DIAGNOSIS — S3991XA Unspecified injury of abdomen, initial encounter: Secondary | ICD-10-CM | POA: Diagnosis not present

## 2021-07-12 DIAGNOSIS — S0990XA Unspecified injury of head, initial encounter: Secondary | ICD-10-CM | POA: Insufficient documentation

## 2021-07-12 DIAGNOSIS — Y92410 Unspecified street and highway as the place of occurrence of the external cause: Secondary | ICD-10-CM | POA: Insufficient documentation

## 2021-07-12 LAB — CBC WITH DIFFERENTIAL/PLATELET
Abs Immature Granulocytes: 0 10*3/uL (ref 0.00–0.07)
Basophils Absolute: 0 10*3/uL (ref 0.0–0.1)
Basophils Relative: 1 %
Eosinophils Absolute: 0 10*3/uL (ref 0.0–0.5)
Eosinophils Relative: 1 %
HCT: 39.9 % (ref 36.0–46.0)
Hemoglobin: 13.2 g/dL (ref 12.0–15.0)
Immature Granulocytes: 0 %
Lymphocytes Relative: 47 %
Lymphs Abs: 2.1 10*3/uL (ref 0.7–4.0)
MCH: 28.7 pg (ref 26.0–34.0)
MCHC: 33.1 g/dL (ref 30.0–36.0)
MCV: 86.7 fL (ref 80.0–100.0)
Monocytes Absolute: 0.5 10*3/uL (ref 0.1–1.0)
Monocytes Relative: 12 %
Neutro Abs: 1.7 10*3/uL (ref 1.7–7.7)
Neutrophils Relative %: 39 %
Platelets: 191 10*3/uL (ref 150–400)
RBC: 4.6 MIL/uL (ref 3.87–5.11)
RDW: 13 % (ref 11.5–15.5)
WBC: 4.3 10*3/uL (ref 4.0–10.5)
nRBC: 0 % (ref 0.0–0.2)

## 2021-07-12 LAB — COMPREHENSIVE METABOLIC PANEL
ALT: 29 U/L (ref 0–44)
AST: 21 U/L (ref 15–41)
Albumin: 4.2 g/dL (ref 3.5–5.0)
Alkaline Phosphatase: 47 U/L (ref 38–126)
Anion gap: 7 (ref 5–15)
BUN: 16 mg/dL (ref 6–20)
CO2: 30 mmol/L (ref 22–32)
Calcium: 9.5 mg/dL (ref 8.9–10.3)
Chloride: 99 mmol/L (ref 98–111)
Creatinine, Ser: 0.78 mg/dL (ref 0.44–1.00)
GFR, Estimated: >60 mL/min (ref >60)
Glucose, Bld: 92 mg/dL (ref 70–99)
Potassium: 3.7 mmol/L (ref 3.5–5.1)
Sodium: 136 mmol/L (ref 135–145)
Total Bilirubin: 0.8 mg/dL (ref 0.3–1.2)
Total Protein: 7.5 g/dL (ref 6.5–8.1)

## 2021-07-12 LAB — PREGNANCY, URINE: Preg Test, Ur: NEGATIVE

## 2021-07-12 MED ORDER — IOHEXOL 300 MG/ML  SOLN
100.0000 mL | Freq: Once | INTRAMUSCULAR | Status: AC | PRN
  Administered 2021-07-12: 100 mL via INTRAVENOUS
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
## 2021-07-12 NOTE — ED Notes (Signed)
RN provided AVS using Teachback Method. Patient verbalizes understanding of Discharge Instructions. Opportunity for Questioning and Answers were provided by RN. Patient Discharged from ED ambulatory to Home via Self.  

## 2021-07-12 NOTE — ED Notes (Signed)
Patient transported to CT at this Time. ?

## 2021-07-12 NOTE — ED Notes (Signed)
Patient returned from CT at this Time. ?

## 2021-07-12 NOTE — Pre-Procedure Instructions (Signed)
Surgical Instructions    Your procedure is scheduled on Wednesday 07/17/21.   Report to [facility] Main Entrance "A" at 06:30 A.M., then check in with the Admitting office.  Call this number if you have problems the morning of surgery:  [phone]   If you have any questions prior to your surgery date call [phone]: Open Monday-Friday 8am-4pm    Remember:  Do not eat or drink after midnight the night before your surgery    Take these medicines the morning of surgery with A SIP OF WATER   NONE  Please follow your surgeon's instructions regarding ELIQUIS. If you have not received instructions then please contact your surgeon's office for instructions.   As of today, STOP taking any Aspirin (unless otherwise instructed by your surgeon) Aleve, Naproxen, Ibuprofen, Motrin, Advil, Goody's, BC's, all herbal medications, fish oil, and all vitamins.                     Do NOT Smoke (Tobacco/Vaping) or drink Alcohol 24 hours prior to your procedure.  If you use a CPAP at night, you may bring all equipment for your overnight stay.   Contacts, glasses, piercing's, hearing aid's, dentures or partials may not be worn into surgery, please bring cases for these belongings.    For patients admitted to the hospital, discharge time will be determined by your treatment team.   Patients discharged the day of surgery will not be allowed to drive home, and someone needs to stay with them for 24 hours.  NO VISITORS WILL BE ALLOWED IN PRE-OP WHERE PATIENTS GET READY FOR SURGERY.  ONLY 1 SUPPORT PERSON MAY BE PRESENT IN THE WAITING ROOM WHILE YOU ARE IN SURGERY.  IF YOU ARE TO BE ADMITTED, ONCE YOU ARE IN YOUR ROOM YOU WILL BE ALLOWED TWO (2) VISITORS.  Minor children may have two parents present. Special consideration for safety and communication needs will be reviewed on a case by case basis.   Special instructions:   [facility]- Preparing For Surgery  Before surgery, you can play an important  role. Because skin is not sterile, your skin needs to be as free of germs as possible. You can reduce the number of germs on your skin by washing with CHG (chlorahexidine gluconate) Soap before surgery.  CHG is an antiseptic cleaner which kills germs and bonds with the skin to continue killing germs even after washing.    Oral Hygiene is also important to reduce your risk of infection.  Remember - BRUSH YOUR TEETH THE MORNING OF SURGERY WITH YOUR REGULAR TOOTHPASTE  Please do not use if you have an allergy to CHG or antibacterial soaps. If your skin becomes reddened/irritated stop using the CHG.  Do not shave (including legs and underarms) for at least 48 hours prior to first CHG shower. It is OK to shave your face.  Please follow these instructions carefully.   Shower the NIGHT BEFORE SURGERY and the MORNING OF SURGERY  If you chose to wash your hair, wash your hair first as usual with your normal shampoo.  After you shampoo, rinse your hair and body thoroughly to remove the shampoo.  Use CHG Soap as you would any other liquid soap. You can apply CHG directly to the skin and wash gently with a scrungie or a clean washcloth.   Apply the CHG Soap to your body ONLY FROM THE NECK DOWN.  Do not use on open wounds or open sores. Avoid contact with your  eyes, ears, mouth and genitals (private parts). Wash Face and genitals (private parts)  with your normal soap.   Wash thoroughly, paying special attention to the area where your surgery will be performed.  Thoroughly rinse your body with warm water from the neck down.  DO NOT shower/wash with your normal soap after using and rinsing off the CHG Soap.  Pat yourself dry with a CLEAN TOWEL.  Wear CLEAN PAJAMAS to bed the night before surgery  Place CLEAN SHEETS on your bed the night before your surgery  DO NOT SLEEP WITH PETS.   Day of Surgery: Shower with CHG soap. Do not wear jewelry, make up, nail polish, gel polish, artificial nails, or  any other type of covering on natural nails including finger and toenails. If patients have artificial nails, gel coating, etc. that need to be removed by a nail salon please have this removed prior to surgery. Surgery may need to be canceled/delayed if the surgeon/ anesthesia feels like the patient is unable to be adequately monitored. Do not wear lotions, powders, perfumes, or deodorant. Do not shave 48 hours prior to surgery.   Do not bring valuables to the hospital. [facility] is not responsible for any belongings or valuables. Wear Clean/Comfortable clothing the morning of surgery Remember to brush your teeth WITH YOUR REGULAR TOOTHPASTE.   Please read over the following fact sheets that you were given.   3 days prior to your procedure or After your COVID test   You are not required to quarantine however you are required to wear a well-fitting mask when you are out and around people not in your household. If your mask becomes wet or soiled, replace with a new one.   Wash your hands often with soap and water for 20 seconds or clean your hands with an alcohol-based hand sanitizer that contains at least 60% alcohol.   Do not share personal items.   Notify your provider:  o if you are in close contact with someone who has COVID  o or if you develop a fever of 100.4 or greater, sneezing, cough, sore throat, shortness of breath or body aches.

## 2021-07-15 ENCOUNTER — Other Ambulatory Visit: Payer: Self-pay

## 2021-07-15 ENCOUNTER — Encounter (HOSPITAL_COMMUNITY)
Admission: RE | Admit: 2021-07-15 | Discharge: 2021-07-15 | Disposition: A | Discharge status: Home / Self Care | Payer: BC Managed Care – PPO | Source: Ambulatory Visit | Attending: Thoracic Surgery (Cardiothoracic Vascular Surgery) | Admitting: Thoracic Surgery (Cardiothoracic Vascular Surgery)

## 2021-07-15 ENCOUNTER — Encounter (HOSPITAL_COMMUNITY): Payer: Self-pay

## 2021-07-15 ENCOUNTER — Ambulatory Visit (HOSPITAL_COMMUNITY)
Admission: RE | Admit: 2021-07-15 | Discharge: 2021-07-15 | Disposition: A | Discharge status: Home / Self Care | Payer: BC Managed Care – PPO | Source: Ambulatory Visit | Attending: Thoracic Surgery (Cardiothoracic Vascular Surgery) | Admitting: Thoracic Surgery (Cardiothoracic Vascular Surgery)

## 2021-07-15 VITALS — BP 135/60 | HR 64 | Temp 97.9°F | Resp 17 | Ht 65.0 in | Wt 161.4 lb

## 2021-07-15 DIAGNOSIS — Z01818 Encounter for other preprocedural examination: Secondary | ICD-10-CM

## 2021-07-15 DIAGNOSIS — D492 Neoplasm of unspecified behavior of bone, soft tissue, and skin: Secondary | ICD-10-CM | POA: Insufficient documentation

## 2021-07-15 DIAGNOSIS — Z20822 Contact with and (suspected) exposure to covid-19: Secondary | ICD-10-CM | POA: Insufficient documentation

## 2021-07-15 LAB — URINALYSIS, ROUTINE W REFLEX MICROSCOPIC
Bilirubin Urine: NEGATIVE
Glucose, UA: NEGATIVE mg/dL
Hgb urine dipstick: NEGATIVE
Ketones, ur: NEGATIVE mg/dL
Leukocytes,Ua: NEGATIVE
Nitrite: NEGATIVE
Protein, ur: NEGATIVE mg/dL
Specific Gravity, Urine: 1.025 (ref 1.005–1.030)
pH: 7 (ref 5.0–8.0)

## 2021-07-15 LAB — COMPREHENSIVE METABOLIC PANEL
ALT: 25 U/L (ref 0–44)
AST: 25 U/L (ref 15–41)
Albumin: 3.4 g/dL — ABNORMAL LOW (ref 3.5–5.0)
Alkaline Phosphatase: 44 U/L (ref 38–126)
Anion gap: 6 (ref 5–15)
BUN: 20 mg/dL (ref 6–20)
CO2: 21 mmol/L — ABNORMAL LOW (ref 22–32)
Calcium: 8.3 mg/dL — ABNORMAL LOW (ref 8.9–10.3)
Chloride: 105 mmol/L (ref 98–111)
Creatinine, Ser: 0.74 mg/dL (ref 0.44–1.00)
GFR, Estimated: >60 mL/min (ref >60)
Glucose, Bld: 95 mg/dL (ref 70–99)
Potassium: 3.9 mmol/L (ref 3.5–5.1)
Sodium: 132 mmol/L — ABNORMAL LOW (ref 135–145)
Total Bilirubin: 0.9 mg/dL (ref 0.3–1.2)
Total Protein: 6.4 g/dL — ABNORMAL LOW (ref 6.5–8.1)

## 2021-07-15 LAB — SARS CORONAVIRUS 2 (TAT 6-24 HRS): SARS Coronavirus 2: NEGATIVE

## 2021-07-15 LAB — SURGICAL PCR SCREEN
MRSA, PCR: NEGATIVE
Staphylococcus aureus: NEGATIVE

## 2021-07-15 LAB — CBC
HCT: 36.3 % (ref 36.0–46.0)
Hemoglobin: 12.2 g/dL (ref 12.0–15.0)
MCH: 29.4 pg (ref 26.0–34.0)
MCHC: 33.6 g/dL (ref 30.0–36.0)
MCV: 87.5 fL (ref 80.0–100.0)
Platelets: 203 10*3/uL (ref 150–400)
RBC: 4.15 MIL/uL (ref 3.87–5.11)
RDW: 12.7 % (ref 11.5–15.5)
WBC: 3.7 10*3/uL — ABNORMAL LOW (ref 4.0–10.5)
nRBC: 0 % (ref 0.0–0.2)

## 2021-07-15 LAB — BLOOD GAS, ARTERIAL
Acid-base deficit: 1.4 mmol/L (ref 0.0–2.0)
Bicarbonate: 22.4 mmol/L (ref 20.0–28.0)
Drawn by: 58793
FIO2: 21
O2 Saturation: 98.9 %
Patient temperature: 37
pCO2 arterial: 34.6 mmHg (ref 32.0–48.0)
pH, Arterial: 7.426 (ref 7.350–7.450)
pO2, Arterial: 120 mmHg — ABNORMAL HIGH (ref 83.0–108.0)

## 2021-07-15 LAB — PROTIME-INR
INR: 1 (ref 0.8–1.2)
Prothrombin Time: 13.4 seconds (ref 11.4–15.2)

## 2021-07-15 LAB — APTT: aPTT: 27 seconds (ref 24–36)

## 2021-07-15 NOTE — Progress Notes (Signed)
PCP - Dr. Kathyrn Lass Cardiologist - Dr. Leta Jungling; Dr. Julien Nordmann   PPM/ICD - n/a  Chest x-ray - 07/15/21 EKG - 07/15/21 Stress Test - denies ECHO - denies Cardiac Cath - denies  Sleep Study - denies CPAP - denies  Blood Thinner Instructions: Eliquis; LD 07/14/21 Aspirin Instructions: n/a  NPO  COVID TEST- 07/15/21 done in PAT; pt tested positive on 04/16/21 which is on the cusp of the 90 day mark. Pt's note from [place] placed in chart for documentation purposes just in case. Pt was open to testing regardless of previous positive status.   Anesthesia review: No  Patient denies shortness of breath, fever, cough and chest pain at PAT appointment   All instructions explained to the patient, with a verbal understanding of the material. Patient agrees to go over the instructions while at home for a better understanding. Patient also instructed to self quarantine after being tested for COVID-19. The opportunity to ask questions was provided.

## 2021-07-17 ENCOUNTER — Encounter (HOSPITAL_COMMUNITY)
Admission: RE | Disposition: A | Discharge status: Home / Self Care | Payer: Self-pay | Source: Home / Self Care | Attending: Thoracic Surgery (Cardiothoracic Vascular Surgery)

## 2021-07-17 ENCOUNTER — Inpatient Hospital Stay (HOSPITAL_COMMUNITY)
Admission: RE | Admit: 2021-07-17 | Discharge: 2021-07-19 | DRG: 167 | Disposition: A | Discharge status: Home / Self Care | Payer: BC Managed Care – PPO | Attending: Thoracic Surgery (Cardiothoracic Vascular Surgery) | Admitting: Thoracic Surgery (Cardiothoracic Vascular Surgery)

## 2021-07-17 ENCOUNTER — Inpatient Hospital Stay (HOSPITAL_COMMUNITY): Payer: BC Managed Care – PPO

## 2021-07-17 ENCOUNTER — Inpatient Hospital Stay (HOSPITAL_COMMUNITY): Payer: BC Managed Care – PPO | Admitting: Anesthesiology

## 2021-07-17 ENCOUNTER — Encounter (HOSPITAL_COMMUNITY): Payer: Self-pay | Admitting: Thoracic Surgery (Cardiothoracic Vascular Surgery)

## 2021-07-17 DIAGNOSIS — Z7901 Long term (current) use of anticoagulants: Secondary | ICD-10-CM | POA: Diagnosis not present

## 2021-07-17 DIAGNOSIS — D62 Acute posthemorrhagic anemia: Secondary | ICD-10-CM | POA: Diagnosis not present

## 2021-07-17 DIAGNOSIS — Z9889 Other specified postprocedural states: Secondary | ICD-10-CM

## 2021-07-17 DIAGNOSIS — I10 Essential (primary) hypertension: Secondary | ICD-10-CM | POA: Diagnosis present

## 2021-07-17 DIAGNOSIS — R911 Solitary pulmonary nodule: Principal | ICD-10-CM | POA: Diagnosis present

## 2021-07-17 DIAGNOSIS — Z86718 Personal history of other venous thrombosis and embolism: Secondary | ICD-10-CM | POA: Diagnosis not present

## 2021-07-17 DIAGNOSIS — J9383 Other pneumothorax: Secondary | ICD-10-CM | POA: Diagnosis not present

## 2021-07-17 DIAGNOSIS — Z79899 Other long term (current) drug therapy: Secondary | ICD-10-CM

## 2021-07-17 DIAGNOSIS — J939 Pneumothorax, unspecified: Secondary | ICD-10-CM

## 2021-07-17 DIAGNOSIS — Z79890 Hormone replacement therapy (postmenopausal): Secondary | ICD-10-CM

## 2021-07-17 DIAGNOSIS — D492 Neoplasm of unspecified behavior of bone, soft tissue, and skin: Secondary | ICD-10-CM

## 2021-07-17 DIAGNOSIS — Z20822 Contact with and (suspected) exposure to covid-19: Secondary | ICD-10-CM | POA: Diagnosis present

## 2021-07-17 DIAGNOSIS — R222 Localized swelling, mass and lump, trunk: Secondary | ICD-10-CM | POA: Diagnosis present

## 2021-07-17 DIAGNOSIS — I440 Atrioventricular block, first degree: Secondary | ICD-10-CM | POA: Diagnosis present

## 2021-07-17 DIAGNOSIS — Z4682 Encounter for fitting and adjustment of non-vascular catheter: Secondary | ICD-10-CM

## 2021-07-17 HISTORY — PX: INTERCOSTAL NERVE BLOCK: SHX5021

## 2021-07-17 LAB — ABO/RH: ABO/RH(D): A POS

## 2021-07-17 LAB — PREPARE RBC (CROSSMATCH)

## 2021-07-17 LAB — POCT PREGNANCY, URINE: Preg Test, Ur: NEGATIVE

## 2021-07-17 SURGERY — EXCISION, MASS, MEDIASTINUM, ROBOT-ASSISTED
Anesthesia: General | Site: Chest | Laterality: Right

## 2021-07-17 MED ORDER — KETAMINE HCL 50 MG/5ML IJ SOSY
PREFILLED_SYRINGE | INTRAMUSCULAR | Status: AC
  Filled 2021-07-17: qty 5

## 2021-07-17 MED ORDER — SODIUM CHLORIDE 0.9 % IV SOLN: INTRAVENOUS | Status: DC

## 2021-07-17 MED ORDER — ACETAMINOPHEN 160 MG/5ML PO SOLN: 1000.0000 mg | Freq: Four times a day (QID) | ORAL | Status: DC

## 2021-07-17 MED ORDER — SENNOSIDES-DOCUSATE SODIUM 8.6-50 MG PO TABS
1.0000 | ORAL_TABLET | Freq: Every day | ORAL | Status: DC
  Administered 2021-07-17: 1 via ORAL
  Filled 2021-07-17: qty 1

## 2021-07-17 MED ORDER — KETOROLAC TROMETHAMINE 30 MG/ML IJ SOLN
INTRAMUSCULAR | Status: DC | PRN
  Administered 2021-07-17: 30 mg via INTRAVENOUS

## 2021-07-17 MED ORDER — FENTANYL CITRATE (PF) 100 MCG/2ML IJ SOLN
25.0000 ug | INTRAMUSCULAR | Status: DC | PRN
  Administered 2021-07-17 (×3): 50 ug via INTRAVENOUS

## 2021-07-17 MED ORDER — ORAL CARE MOUTH RINSE: 15.0000 mL | Freq: Once | OROMUCOSAL | Status: AC

## 2021-07-17 MED ORDER — BISACODYL 5 MG PO TBEC
10.0000 mg | DELAYED_RELEASE_TABLET | Freq: Every day | ORAL | Status: DC
  Administered 2021-07-18: 10 mg via ORAL
  Filled 2021-07-17 (×2): qty 2

## 2021-07-17 MED ORDER — CEFAZOLIN SODIUM-DEXTROSE 2-4 GM/100ML-% IV SOLN
INTRAVENOUS | Status: AC
  Administered 2021-07-18: 2 g via INTRAVENOUS
  Filled 2021-07-17: qty 100

## 2021-07-17 MED ORDER — SUGAMMADEX SODIUM 200 MG/2ML IV SOLN
INTRAVENOUS | Status: DC | PRN
  Administered 2021-07-17: 150 mg via INTRAVENOUS

## 2021-07-17 MED ORDER — HYDROMORPHONE HCL 1 MG/ML IJ SOLN
0.5000 mg | INTRAMUSCULAR | Status: DC | PRN
  Administered 2021-07-17: 0.5 mg via INTRAVENOUS

## 2021-07-17 MED ORDER — CEFAZOLIN SODIUM-DEXTROSE 2-4 GM/100ML-% IV SOLN
2.0000 g | INTRAVENOUS | Status: AC
  Administered 2021-07-17: 2 g via INTRAVENOUS

## 2021-07-17 MED ORDER — SODIUM CHLORIDE 0.9 % IV SOLN
INTRAVENOUS | Status: AC | PRN
  Administered 2021-07-17: 1000 mL via INTRAMUSCULAR

## 2021-07-17 MED ORDER — 0.9 % SODIUM CHLORIDE (POUR BTL) OPTIME
TOPICAL | Status: DC | PRN
  Administered 2021-07-17: 1000 mL

## 2021-07-17 MED ORDER — ENOXAPARIN SODIUM 40 MG/0.4ML IJ SOSY
40.0000 mg | PREFILLED_SYRINGE | Freq: Every day | INTRAMUSCULAR | Status: DC
  Administered 2021-07-17: 40 mg via SUBCUTANEOUS
  Filled 2021-07-17: qty 0.4

## 2021-07-17 MED ORDER — CEFAZOLIN SODIUM-DEXTROSE 2-4 GM/100ML-% IV SOLN
2.0000 g | Freq: Three times a day (TID) | INTRAVENOUS | Status: AC
  Administered 2021-07-17: 2 g via INTRAVENOUS
  Filled 2021-07-17 (×2): qty 100

## 2021-07-17 MED ORDER — LACTATED RINGERS IV SOLN: INTRAVENOUS | Status: DC

## 2021-07-17 MED ORDER — MORPHINE SULFATE (PF) 2 MG/ML IV SOLN: 2.0000 mg | INTRAVENOUS | Status: DC | PRN

## 2021-07-17 MED ORDER — PHENYLEPHRINE HCL (PRESSORS) 10 MG/ML IV SOLN
INTRAVENOUS | Status: DC | PRN
  Administered 2021-07-17 (×2): 80 ug via INTRAVENOUS

## 2021-07-17 MED ORDER — MIDAZOLAM HCL 5 MG/5ML IJ SOLN
INTRAMUSCULAR | Status: DC | PRN
  Administered 2021-07-17: 2 mg via INTRAVENOUS

## 2021-07-17 MED ORDER — BUPIVACAINE LIPOSOME 1.3 % IJ SUSP
INTRAMUSCULAR | Status: AC
  Filled 2021-07-17: qty 20

## 2021-07-17 MED ORDER — ONDANSETRON HCL 4 MG/2ML IJ SOLN
4.0000 mg | Freq: Four times a day (QID) | INTRAMUSCULAR | Status: DC | PRN
  Administered 2021-07-17: 4 mg via INTRAVENOUS
  Filled 2021-07-17: qty 2

## 2021-07-17 MED ORDER — HEMOSTATIC AGENTS (NO CHARGE) OPTIME
TOPICAL | Status: DC | PRN
  Administered 2021-07-17 (×2): 1 via TOPICAL

## 2021-07-17 MED ORDER — SODIUM CHLORIDE 0.9% IV SOLUTION: Freq: Once | INTRAVENOUS | Status: DC

## 2021-07-17 MED ORDER — CHLORHEXIDINE GLUCONATE 0.12 % MT SOLN
OROMUCOSAL | Status: AC
  Administered 2021-07-17: 15 mL via OROMUCOSAL
  Filled 2021-07-17: qty 15

## 2021-07-17 MED ORDER — FENTANYL CITRATE (PF) 100 MCG/2ML IJ SOLN
INTRAMUSCULAR | Status: AC
  Filled 2021-07-17: qty 2

## 2021-07-17 MED ORDER — KETOROLAC TROMETHAMINE 15 MG/ML IJ SOLN
15.0000 mg | Freq: Four times a day (QID) | INTRAMUSCULAR | Status: DC
  Administered 2021-07-17 – 2021-07-18 (×3): 15 mg via INTRAVENOUS
  Filled 2021-07-17 (×3): qty 1

## 2021-07-17 MED ORDER — AMISULPRIDE (ANTIEMETIC) 5 MG/2ML IV SOLN: 10.0000 mg | Freq: Once | INTRAVENOUS | Status: DC | PRN

## 2021-07-17 MED ORDER — TRAMADOL HCL 50 MG PO TABS: 50.0000 mg | ORAL_TABLET | Freq: Four times a day (QID) | ORAL | Status: DC | PRN

## 2021-07-17 MED ORDER — BUPIVACAINE HCL (PF) 0.5 % IJ SOLN
INTRAMUSCULAR | Status: AC
  Filled 2021-07-17: qty 30

## 2021-07-17 MED ORDER — ROCURONIUM BROMIDE 10 MG/ML (PF) SYRINGE
PREFILLED_SYRINGE | INTRAVENOUS | Status: DC | PRN
  Administered 2021-07-17: 50 mg via INTRAVENOUS
  Administered 2021-07-17: 20 mg via INTRAVENOUS

## 2021-07-17 MED ORDER — ONDANSETRON HCL 4 MG/2ML IJ SOLN
INTRAMUSCULAR | Status: DC | PRN
Start: 2021-07-17 — End: 2021-07-17
  Administered 2021-07-17: 4 mg via INTRAVENOUS

## 2021-07-17 MED ORDER — MIDAZOLAM HCL 2 MG/2ML IJ SOLN
INTRAMUSCULAR | Status: AC
  Filled 2021-07-17: qty 2

## 2021-07-17 MED ORDER — HYDROMORPHONE HCL 1 MG/ML IJ SOLN
INTRAMUSCULAR | Status: AC
  Filled 2021-07-17: qty 1

## 2021-07-17 MED ORDER — ACETAMINOPHEN 500 MG PO TABS
1000.0000 mg | ORAL_TABLET | Freq: Four times a day (QID) | ORAL | Status: DC
  Administered 2021-07-17 – 2021-07-19 (×7): 1000 mg via ORAL
  Filled 2021-07-17 (×7): qty 2

## 2021-07-17 MED ORDER — PHENYLEPHRINE HCL-NACL 20-0.9 MG/250ML-% IV SOLN
INTRAVENOUS | Status: DC | PRN
Start: 2021-07-17 — End: 2021-07-17
  Administered 2021-07-17: 15 ug/min via INTRAVENOUS

## 2021-07-17 MED ORDER — PROPOFOL 10 MG/ML IV BOLUS
INTRAVENOUS | Status: AC
  Filled 2021-07-17: qty 20

## 2021-07-17 MED ORDER — LIDOCAINE 2% (20 MG/ML) 5 ML SYRINGE
INTRAMUSCULAR | Status: DC | PRN
  Administered 2021-07-17: 60 mg via INTRAVENOUS

## 2021-07-17 MED ORDER — CHLORHEXIDINE GLUCONATE CLOTH 2 % EX PADS
6.0000 | MEDICATED_PAD | Freq: Every day | CUTANEOUS | Status: DC
  Administered 2021-07-17: 6 via TOPICAL

## 2021-07-17 MED ORDER — HYDROCHLOROTHIAZIDE 25 MG PO TABS
25.0000 mg | ORAL_TABLET | Freq: Every day | ORAL | Status: DC
  Administered 2021-07-17: 25 mg via ORAL
  Filled 2021-07-17: qty 1

## 2021-07-17 MED ORDER — ACETAMINOPHEN 500 MG PO TABS
ORAL_TABLET | ORAL | Status: AC
  Administered 2021-07-17: 1000 mg via ORAL
  Filled 2021-07-17: qty 2

## 2021-07-17 MED ORDER — DEXAMETHASONE SODIUM PHOSPHATE 10 MG/ML IJ SOLN
INTRAMUSCULAR | Status: DC | PRN
  Administered 2021-07-17: 5 mg via INTRAVENOUS

## 2021-07-17 MED ORDER — ACETAMINOPHEN 500 MG PO TABS: 1000.0000 mg | ORAL_TABLET | Freq: Once | ORAL | Status: AC

## 2021-07-17 MED ORDER — LACTATED RINGERS IV SOLN: INTRAVENOUS | Status: DC | PRN

## 2021-07-17 MED ORDER — FENTANYL CITRATE (PF) 250 MCG/5ML IJ SOLN
INTRAMUSCULAR | Status: AC
  Filled 2021-07-17: qty 5

## 2021-07-17 MED ORDER — ESMOLOL HCL 100 MG/10ML IV SOLN
INTRAVENOUS | Status: DC | PRN
Start: 2021-07-17 — End: 2021-07-17
  Administered 2021-07-17: 20 mg via INTRAVENOUS

## 2021-07-17 MED ORDER — FENTANYL CITRATE (PF) 250 MCG/5ML IJ SOLN
INTRAMUSCULAR | Status: DC | PRN
  Administered 2021-07-17: 150 ug via INTRAVENOUS
  Administered 2021-07-17 (×2): 50 ug via INTRAVENOUS

## 2021-07-17 MED ORDER — SODIUM CHLORIDE FLUSH 0.9 % IV SOLN
INTRAVENOUS | Status: DC | PRN
  Administered 2021-07-17: 100 mL

## 2021-07-17 MED ORDER — PROPOFOL 10 MG/ML IV BOLUS
INTRAVENOUS | Status: DC | PRN
  Administered 2021-07-17: 150 mg via INTRAVENOUS

## 2021-07-17 MED ORDER — CHLORHEXIDINE GLUCONATE 0.12 % MT SOLN: 15.0000 mL | Freq: Once | OROMUCOSAL | Status: AC

## 2021-07-17 MED ORDER — KETAMINE HCL 10 MG/ML IJ SOLN
INTRAMUSCULAR | Status: DC | PRN
  Administered 2021-07-17: 20 mg via INTRAVENOUS

## 2021-07-17 SURGICAL SUPPLY — 76 items
ADH SKN CLS APL DERMABOND .7 (GAUZE/BANDAGES/DRESSINGS) ×2
BAG SPEC RTRVL C125 8X14 (MISCELLANEOUS) ×2
BLADE STERNUM SYSTEM 6 (BLADE) IMPLANT
CANNULA REDUC XI 12-8 STAPL (CANNULA) ×3
CANNULA REDUCER 12-8 DVNC XI (CANNULA) ×2 IMPLANT
CATH THORACIC 36FR (CATHETERS) IMPLANT
CATH THORACIC 36FR RT ANG (CATHETERS) IMPLANT
COVER TIP SHEARS 8 DVNC (MISCELLANEOUS) IMPLANT
COVER TIP SHEARS 8MM DA VINCI (MISCELLANEOUS)
DEFOGGER SCOPE WARMER CLEARIFY (MISCELLANEOUS) ×3 IMPLANT
DERMABOND ADVANCED (GAUZE/BANDAGES/DRESSINGS) ×1
DERMABOND ADVANCED .7 DNX12 (GAUZE/BANDAGES/DRESSINGS) IMPLANT
DRAIN CHANNEL 28F RND 3/8 FF (WOUND CARE) ×1 IMPLANT
DRAPE ARM DVNC X/XI (DISPOSABLE) ×8 IMPLANT
DRAPE COLUMN DVNC XI (DISPOSABLE) ×2 IMPLANT
DRAPE CV SPLIT W-CLR ANES SCRN (DRAPES) ×3 IMPLANT
DRAPE DA VINCI XI ARM (DISPOSABLE) ×12
DRAPE DA VINCI XI COLUMN (DISPOSABLE) ×3
DRAPE HALF SHEET 40X57 (DRAPES) ×6 IMPLANT
DRAPE ORTHO SPLIT 77X108 STRL (DRAPES) ×3
DRAPE SURG ORHT 6 SPLT 77X108 (DRAPES) ×2 IMPLANT
DRSG AQUACEL AG ADV 3.5X14 (GAUZE/BANDAGES/DRESSINGS) IMPLANT
ELECT REM PT RETURN 9FT ADLT (ELECTROSURGICAL) ×3
ELECTRODE REM PT RTRN 9FT ADLT (ELECTROSURGICAL) IMPLANT
FELT TEFLON 1X6 (MISCELLANEOUS) IMPLANT
GAUZE KITTNER 4X8 (MISCELLANEOUS) ×3 IMPLANT
GAUZE SPONGE 4X4 12PLY STRL (GAUZE/BANDAGES/DRESSINGS) ×1 IMPLANT
GLOVE SURG MICRO LTX SZ7.5 (GLOVE) ×6 IMPLANT
GOWN STRL REUS W/ TWL LRG LVL3 (GOWN DISPOSABLE) ×2 IMPLANT
GOWN STRL REUS W/ TWL XL LVL3 (GOWN DISPOSABLE) ×2 IMPLANT
GOWN STRL REUS W/TWL 2XL LVL3 (GOWN DISPOSABLE) ×3 IMPLANT
GOWN STRL REUS W/TWL LRG LVL3 (GOWN DISPOSABLE) ×6
GOWN STRL REUS W/TWL XL LVL3 (GOWN DISPOSABLE) ×6
HEMOSTAT POWDER SURGIFOAM 1G (HEMOSTASIS) IMPLANT
HEMOSTAT SURGICEL 2X14 (HEMOSTASIS) ×6 IMPLANT
IRRIGATION STRYKERFLOW (MISCELLANEOUS) ×4 IMPLANT
IRRIGATOR STRYKERFLOW (MISCELLANEOUS) ×3
KIT SUCTION CATH 14FR (SUCTIONS) IMPLANT
NDL 25GX 5/8IN NON SAFETY (NEEDLE) ×2 IMPLANT
NDL SPNL 22GX3.5 QUINCKE BK (NEEDLE) ×2 IMPLANT
NEEDLE 25GX 5/8IN NON SAFETY (NEEDLE) ×3 IMPLANT
NEEDLE SPNL 22GX3.5 QUINCKE BK (NEEDLE) ×3 IMPLANT
PACK CHEST (CUSTOM PROCEDURE TRAY) ×3 IMPLANT
PAD ARMBOARD 7.5X6 YLW CONV (MISCELLANEOUS) ×6 IMPLANT
PAD ELECT DEFIB RADIOL ZOLL (MISCELLANEOUS) IMPLANT
RELOAD STAPLE 45 4.3 GRN DVNC (STAPLE) IMPLANT
RELOAD STAPLE 45 4.6 BLK DVNC (STAPLE) IMPLANT
RELOAD STAPLER 4.3X45 GRN DVNC (STAPLE) ×4 IMPLANT
RELOAD STAPLER 45 4.6 BLK DVNC (STAPLE) ×2 IMPLANT
SEAL CANN UNIV 5-8 DVNC XI (MISCELLANEOUS) ×6 IMPLANT
SEAL XI 5MM-8MM UNIVERSAL (MISCELLANEOUS) ×9
SET TRI-LUMEN FLTR TB AIRSEAL (TUBING) ×3 IMPLANT
STAPLER 45 DA VINCI SURE FORM (STAPLE) ×3
STAPLER 45 SUREFORM DVNC (STAPLE) IMPLANT
STAPLER CANNULA SEAL DVNC XI (STAPLE) IMPLANT
STAPLER CANNULA SEAL XI (STAPLE)
STAPLER RELOAD 4.3X45 GREEN (STAPLE) ×6
STAPLER RELOAD 4.3X45 GRN DVNC (STAPLE) ×4
STAPLER RELOAD 45 4.6 BLK (STAPLE) ×3
STAPLER RELOAD 45 4.6 BLK DVNC (STAPLE) ×2
SUT BONE WAX W31G (SUTURE) IMPLANT
SUT SILK 2 0 SH CR/8 (SUTURE) IMPLANT
SUT STEEL 6MS V (SUTURE) IMPLANT
SUT STEEL SZ 6 DBL 3X14 BALL (SUTURE) IMPLANT
SUT VIC AB 1 CTX 36 (SUTURE)
SUT VIC AB 1 CTX36XBRD ANBCTR (SUTURE) IMPLANT
SUT VIC AB 2-0 CTX 36 (SUTURE) IMPLANT
SUT VIC AB 3-0 X1 27 (SUTURE) ×6 IMPLANT
SUT VICRYL 0 UR6 27IN ABS (SUTURE) ×6 IMPLANT
SYR 20CC LL (SYRINGE) ×6 IMPLANT
SYSTEM RETRIEVAL ANCHOR 8 (MISCELLANEOUS) ×1 IMPLANT
SYSTEM SAHARA CHEST DRAIN ATS (WOUND CARE) IMPLANT
TOWEL GREEN STERILE (TOWEL DISPOSABLE) ×1 IMPLANT
TOWEL GREEN STERILE FF (TOWEL DISPOSABLE) IMPLANT
TRAY FOLEY SLVR 16FR TEMP STAT (SET/KITS/TRAYS/PACK) ×1 IMPLANT
TROCAR PORT AIRSEAL 12X150 (TUBING) ×3 IMPLANT

## 2021-07-17 NOTE — H&P (Signed)
HPI: 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     Kristine Kirby is a 46 year old woman recently diagnosed with a DVT in her left leg.  She been having swelling for several months and then got worse.  Diagnosis was made with ultrasound.  She had a CT of the chest to rule out pulmonary embolus.  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  On PET/CT there was low-grade metabolic activity.  I saw her in September.  She was about 6 weeks out from initiating anticoagulation with apixaban.  We discussed surgical removal but wanted to wait until she had been on treatment for at least 3 months.  She is feeling well.  She is not having any issues with shortness of breath.  She does have some occasional swelling in her left leg.  She had a repeat duplex which showed no clot in the common femoral.  There was incomplete resolution of the thrombus in her left femoral-popliteal veins.       Past Medical History:  Diagnosis Date   DVT of deep femoral vein, left (HCC)     Dysmenorrhea     Hypertension     Numbness and tingling in right hand     Solitary fibrous tumor      Pleura              Current Outpatient Medications  Medication Sig Dispense Refill   desogestrel-ethinyl estradiol (CYCLESSA) 0.1/0.125/0.15 -0.025 MG tablet Take 1 tablet by mouth daily.       ELIQUIS 5 MG TABS tablet TAKE 1 TABLET BY MOUTH TWICE A DAY 60 tablet 0   hydrochlorothiazide (MICROZIDE) 12.5 MG capsule Take 12.5 mg by mouth daily.       norethindrone (MICRONOR) 0.35 MG tablet Take 1 tablet by mouth daily.       valACYclovir (VALTREX) 1000 MG tablet Take 1,000 mg by mouth 2 (two) times daily as needed.        No current facility-administered medications for this visit.      Physical Exam BP 114/79    Pulse 63    Resp 20    Ht 5' 5.5" (1.664 m)    Wt 150 lb (68 kg)    LMP 05/15/2021    SpO2 100% Comment: RA   BMI 24.58 kg/m  46 year old  woman in no acute distress Alert and oriented x3 with no focal deficits No cervical supraclavicular adenopathy Lungs clear bilaterally Cardiac regular rate and rhythm, no rub or murmur Abdomen soft nontender Extremities no clubbing, cyanosis.  No peripheral edema   Diagnostic Tests: I again reviewed her CT and PET/CT.  Also reviewed chest x-ray from today.  Right pleural-based mass at diaphragm posteriorly.   Impression: Kristine Kirby is a 46 year old woman with a tumor in the right chest.  This mass is mildly hypermetabolic on PET/CT.  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  Surgical resection is indicated for the great chest mass.  We will plan to do this with a robotic approach.  I discussed the general nature of the procedure including the need for general anesthesia, the incisions to be used, the use of a drainage tube postoperatively, the expected hospital stay, and the overall recovery.  I informed her of the indications, risk, benefits, and alternatives.  She understands the risks include, but not limited to death, MI, DVT, PE,  bleeding, possible need for transfusion, infection, prolonged air leak, as well as possibility of other unforeseeable complications.  She is now over 3 months out from starting anticoagulation.  Her recent duplex showed improvement but not complete resolution of her femoral-popliteal thrombus on the left.  I do feel her risk is sufficiently low at this time that we can proceed with surgery.  We will need to hold her Eliquis for 2 days prior to surgery, and then will resume as soon as it safe afterwards..  She wishes to have the surgery done the week of January 9.   Plan: Robotic right VATS for resection of pleural tumor on Wednesday, 07/17/2021 Hold apixaban for 2 days prior to surgery     Melrose Nakayama, MD [facility] [phone]                Electronically signed by  Melrose Nakayama, MD at 06/11/2021 10:06 AM  No interval change. Now presents for resection of pleural based tumor.  Kristine Standard Roxan Hockey, MD [facility] [phone]

## 2021-07-17 NOTE — Discharge Instructions (Signed)
Robot-Assisted Thoracic Surgery, Care After °The following information offers guidance on how to care for yourself after your procedure. Your health care provider may also give you more specific instructions. If you have problems or questions, contact your health care provider. °What can I expect after the procedure? °After the procedure, it is common to have: °Some pain and aches in the area of your surgical incisions. °Pain when breathing in (inhaling) and coughing. °Tiredness (fatigue). °Trouble sleeping. °Constipation. °Follow these instructions at home: °Medicines °Take over-the-counter and prescription medicines only as told by your health care provider. °If you were prescribed an antibiotic medicine, take it as told by your health care provider. Do not stop taking the antibiotic even if you start to feel better. °Talk with your health care provider about safe and effective ways to manage pain after your procedure. Pain management should fit your specific health needs. °Take pain medicine before pain becomes severe. Relieving and controlling your pain will make breathing easier for you. °Ask your health care provider if the medicine prescribed to you requires you to avoid driving or using machinery. °Eating and drinking °Follow instructions from your health care provider about eating or drinking restrictions. These will vary depending on what procedure you had. Your health care provider may recommend: °A liquid diet or soft diet for the first few days. °Meals that are smaller and more frequent. °A diet of fruits, vegetables, whole grains, and low-fat proteins. °Limiting foods that are high in fat and processed sugar, including fried or sweet foods. °Incision care °Follow instructions from your health care provider about how to take care of your incisions. Make sure you: °Wash your hands with soap and water for at least 20 seconds before and after you change your bandage (dressing). If soap and water are not  available, use hand sanitizer. °Change your dressing as told by your health care provider. °Leave stitches (sutures), skin glue, or adhesive strips in place. These skin closures may need to stay in place for 2 weeks or longer. If adhesive strip edges start to loosen and curl up, you may trim the loose edges. Do not remove adhesive strips completely unless your health care provider tells you to do that. °Check your incision area every day for signs of infection. Check for: °Redness, swelling, or more pain. °Fluid or blood. °Warmth. °Pus or a bad smell. °Activity °Return to your normal activities as told by your health care provider. Ask your health care provider what activities are safe for you. °Ask your health care provider when it is safe for you to drive. °Do not lift anything that is heavier than 10 lb (4.5 kg), or the limit that you are told, until your health care provider says that it is safe. °Rest as told by your health care provider. °Avoid sitting for a long time without moving. Get up to take short walks every 1-2 hours. This is important to improve blood flow and breathing. Ask for help if you feel weak or unsteady. °Do exercises as told by your health care provider. °Pneumonia prevention °Do deep breathing exercises and cough regularly as directed. This helps clear mucus and opens your lungs. Doing this helps prevent lung infection (pneumonia). °If you were given an incentive spirometer, use it as told. An incentive spirometer is a tool that measures how well you are filling your lungs with each breath. °Coughing may hurt less if you try to support your chest. This is called splinting. Try one of these when you cough: °  Hold a pillow against your chest. °Place the palms of both hands on top of your incision area. °Do not use any products that contain nicotine or tobacco. These products include cigarettes, chewing tobacco, and vaping devices, such as e-cigarettes. If you need help quitting, ask your  health care provider. °Avoid secondhand smoke. °General instructions °If you have a drainage tube: °Follow instructions from your health care provider about how to take care of it. °Do not travel by airplane after your tube is removed until your health care provider tells you it is safe. °You may need to take these actions to prevent or treat constipation: °Drink enough fluid to keep your urine pale yellow. °Take over-the-counter or prescription medicines. °Eat foods that are high in fiber, such as beans, whole grains, and fresh fruits and vegetables. °Limit foods that are high in fat and processed sugars, such as fried or sweet foods. °Keep all follow-up visits. This is important. °Contact a health care provider if: °You have redness, swelling, or more pain around an incision. °You have fluid or blood coming from an incision. °An incision feels warm to the touch. °You have pus or a bad smell coming from an incision. °You have a fever. °You cannot eat or drink without vomiting. °Your pain medicine is not controlling your pain. °Get help right away if: °You have chest pain. °Your heart is beating quickly. °You have trouble breathing. °You have trouble speaking. °You are confused. °You feel weak or dizzy, or you faint. °These symptoms may represent a serious problem that is an emergency. Do not wait to see if the symptoms will go away. Get medical help right away. Call your local emergency services (911 in the [place] Do not drive yourself to the hospital. °Summary °Talk with your health care provider about safe and effective ways to manage pain after your procedure. Pain management should fit your specific health needs. °Return to your normal activities as told by your health care provider. Ask your health care provider what activities are safe for you. °Do deep breathing exercises and cough regularly as directed. This helps to clear mucus and prevent pneumonia. If it hurts to cough, ease pain by holding a pillow  against your chest or by placing the palms of both hands over your incisions. °This information is not intended to replace advice given to you by your health care provider. Make sure you discuss any questions you have with your health care provider. °Document Revised: 03/16/2020 Document Reviewed: 03/16/2020 °Elsevier Patient Education © 2022 [employer] °  °

## 2021-07-17 NOTE — Anesthesia Preprocedure Evaluation (Signed)
Anesthesia Evaluation  Patient identified by MRN, date of birth, ID band Patient awake    Reviewed: Allergy & Precautions, NPO status , Patient's Chart, lab work & pertinent test results  Airway Mallampati: II  TM Distance: >3 FB Neck ROM: Full    Dental  (+) Dental Advisory Given   Pulmonary neg pulmonary ROS,    breath sounds clear to auscultation       Cardiovascular hypertension, Pt. on medications  Rhythm:Regular Rate:Normal     Neuro/Psych negative neurological ROS     GI/Hepatic negative GI ROS, Neg liver ROS,   Endo/Other  negative endocrine ROS  Renal/GU negative Renal ROS     Musculoskeletal   Abdominal   Peds  Hematology negative hematology ROS (+)   Anesthesia Other Findings   Reproductive/Obstetrics                             Anesthesia Physical Anesthesia Plan  ASA: 2  Anesthesia Plan: General   Post-op Pain Management: Tylenol PO (pre-op), Minimal or no pain anticipated and Toradol IV (intra-op)   Induction: Intravenous  PONV Risk Score and Plan: 3 and Dexamethasone, Ondansetron, Midazolam and Treatment may vary due to age or medical condition  Airway Management Planned: Double Lumen EBT  Additional Equipment:   Intra-op Plan:   Post-operative Plan: Extubation in OR  Informed Consent: I have reviewed the patients History and Physical, chart, labs and discussed the procedure including the risks, benefits and alternatives for the proposed anesthesia with the patient or authorized representative who has indicated his/her understanding and acceptance.     Dental advisory given  Plan Discussed with: CRNA  Anesthesia Plan Comments: (2IV's, Clearsight, 37L DL EBT, GETA.)        Anesthesia Quick Evaluation

## 2021-07-17 NOTE — Interval H&P Note (Signed)
History and Physical Interval Note:  07/17/2021 8:04 AM  Kristine Kirby  has presented today for surgery, with the diagnosis of RIGHT PLEURAL TUMOR.  The various methods of treatment have been discussed with the patient and family. After consideration of risks, benefits and other options for treatment, the patient has consented to  Procedure(s): XI ROBOTIC ASSISTED RESECTION OF PLEURAL TUMOR (Right) as a surgical intervention.  The patient's history has been reviewed, patient examined, no change in status, stable for surgery.  I have reviewed the patient's chart and labs.  Questions were answered to the patient's satisfaction.     Melrose Nakayama

## 2021-07-17 NOTE — Anesthesia Procedure Notes (Signed)
Procedure Name: Intubation Date/Time: 07/17/2021 8:43 AM Performed by: Glynda Jaeger, CRNA Pre-anesthesia Checklist: Patient identified, Patient being monitored, Timeout performed, Emergency Drugs available and Suction available Patient Re-evaluated:Patient Re-evaluated prior to induction Oxygen Delivery Method: Circle System Utilized Preoxygenation: Pre-oxygenation with 100% oxygen Induction Type: IV induction Ventilation: Mask ventilation without difficulty and Oral airway inserted - appropriate to patient size Laryngoscope Size: Mac and 4 Grade View: Grade I Tube type: Oral Endobronchial tube: Double lumen EBT and 37 Fr Number of attempts: 1 Airway Equipment and Method: Stylet Placement Confirmation: ETT inserted through vocal cords under direct vision, positive ETCO2 and breath sounds checked- equal and bilateral Secured at: 30 cm Tube secured with: Tape Dental Injury: Teeth and Oropharynx as per pre-operative assessment

## 2021-07-17 NOTE — Transfer of Care (Signed)
Immediate Anesthesia Transfer of Care Note  Patient: Kristine Kirby  Procedure(s) Performed: XI ROBOTIC ASSISTED RESECTION OF PLEURAL TUMOR (Right) INTERCOSTAL NERVE BLOCK (Right: Chest)  Patient Location: PACU  Anesthesia Type:General  Level of Consciousness: drowsy and responds to stimulation  Airway & Oxygen Therapy: Patient Spontanous Breathing and Patient connected to face mask oxygen  Post-op Assessment: Report given to RN, Post -op Vital signs reviewed and stable and Patient moving all extremities X 4  Post vital signs: Reviewed and stable  Last Vitals:  Vitals Value Taken Time  BP    Temp    Pulse    Resp    SpO2      Last Pain:  Vitals:   07/17/21 0656  TempSrc: Oral         Complications: No notable events documented.

## 2021-07-17 NOTE — Brief Op Note (Addendum)
07/17/2021  10:39 AM  PATIENT:  Kristine Kirby  46 y.o. female  PRE-OPERATIVE DIAGNOSIS:  RIGHT PLEURAL BASED TUMOR  POST-OPERATIVE DIAGNOSIS:  RIGHT LOWER LOBE LUNG NODULE  PROCEDURE:  XI ROBOTIC ASSISTED RIGHT THORACOSCOPY WEDGE RESECTION OF RIGHT LOWER LOBE LUNG NODULE INTERCOSTAL NERVE BLOCKS- Levels 3-10 (Right)  SURGEON: Melrose Nakayama, MD - Primary  PHYSICIAN ASSISTANT: Enid Cutter, PA  ASSISTANTS: Buddy Duty, CST, Scrub Person   ANESTHESIA:   general  EBL:  5 mL   BLOOD ADMINISTERED:none  DRAINS:  Right 49fr pleural Blake drain    LOCAL MEDICATIONS USED:  Local and right intercostal Exparel  SPECIMEN:  Wedge biopsy right lung nodule and  biopsy of right right pleural mass  DISPOSITION OF SPECIMEN:  PATHOLOGY  COUNTS:  YES  DICTATION: .Dragon Dictation  PLAN OF CARE: Admit to inpatient   PATIENT DISPOSITION:  PACU - hemodynamically stable.   Delay start of Pharmacological VTE agent (>24hrs) due to surgical blood loss or risk of bleeding: no

## 2021-07-17 NOTE — Anesthesia Postprocedure Evaluation (Signed)
Anesthesia Post Note  Patient: Kristine Kirby  Procedure(s) Performed: XI ROBOTIC ASSISTED RESECTION OF PLEURAL TUMOR (Right) INTERCOSTAL NERVE BLOCK (Right: Chest)     Patient location during evaluation: PACU Anesthesia Type: General Level of consciousness: awake and alert Pain management: pain level controlled Vital Signs Assessment: post-procedure vital signs reviewed and stable Respiratory status: spontaneous breathing, nonlabored ventilation, respiratory function stable and patient connected to nasal cannula oxygen Cardiovascular status: blood pressure returned to baseline and stable Postop Assessment: no apparent nausea or vomiting Anesthetic complications: no   No notable events documented.  Last Vitals:  Vitals:   07/17/21 1245 07/17/21 1316  BP: (!) 166/92 (!) 150/98  Pulse: 79 77  Resp: 20 12  Temp: 36.7 C 36.5 C  SpO2: 97% 96%    Last Pain:  Vitals:   07/17/21 1316  TempSrc: Axillary  PainSc: Asleep                 Tiajuana Amass

## 2021-07-17 NOTE — Progress Notes (Signed)
Admission from PACU lethargic but arousal.

## 2021-07-18 ENCOUNTER — Encounter (HOSPITAL_COMMUNITY): Payer: Self-pay | Admitting: Thoracic Surgery (Cardiothoracic Vascular Surgery)

## 2021-07-18 ENCOUNTER — Inpatient Hospital Stay (HOSPITAL_COMMUNITY): Payer: BC Managed Care – PPO

## 2021-07-18 ENCOUNTER — Other Ambulatory Visit (HOSPITAL_COMMUNITY): Payer: Self-pay

## 2021-07-18 ENCOUNTER — Other Ambulatory Visit: Payer: Self-pay

## 2021-07-18 LAB — CBC
HCT: 34 % — ABNORMAL LOW (ref 36.0–46.0)
Hemoglobin: 11.6 g/dL — ABNORMAL LOW (ref 12.0–15.0)
MCH: 29.4 pg (ref 26.0–34.0)
MCHC: 34.1 g/dL (ref 30.0–36.0)
MCV: 86.1 fL (ref 80.0–100.0)
Platelets: 192 10*3/uL (ref 150–400)
RBC: 3.95 MIL/uL (ref 3.87–5.11)
RDW: 12.6 % (ref 11.5–15.5)
WBC: 7.1 10*3/uL (ref 4.0–10.5)
nRBC: 0 % (ref 0.0–0.2)

## 2021-07-18 LAB — BASIC METABOLIC PANEL
Anion gap: 10 (ref 5–15)
BUN: 10 mg/dL (ref 6–20)
CO2: 21 mmol/L — ABNORMAL LOW (ref 22–32)
Calcium: 8.3 mg/dL — ABNORMAL LOW (ref 8.9–10.3)
Chloride: 103 mmol/L (ref 98–111)
Creatinine, Ser: 0.79 mg/dL (ref 0.44–1.00)
GFR, Estimated: >60 mL/min (ref >60)
Glucose, Bld: 135 mg/dL — ABNORMAL HIGH (ref 70–99)
Potassium: 3.5 mmol/L (ref 3.5–5.1)
Sodium: 134 mmol/L — ABNORMAL LOW (ref 135–145)

## 2021-07-18 LAB — SURGICAL PATHOLOGY

## 2021-07-18 MED ORDER — POTASSIUM CHLORIDE CRYS ER 20 MEQ PO TBCR
30.0000 meq | EXTENDED_RELEASE_TABLET | Freq: Two times a day (BID) | ORAL | Status: AC
  Administered 2021-07-18 (×2): 30 meq via ORAL
  Filled 2021-07-18 (×2): qty 1

## 2021-07-18 MED ORDER — KETOROLAC TROMETHAMINE 15 MG/ML IJ SOLN
30.0000 mg | Freq: Four times a day (QID) | INTRAMUSCULAR | Status: AC
  Administered 2021-07-18 – 2021-07-19 (×4): 30 mg via INTRAVENOUS
  Filled 2021-07-18 (×4): qty 2

## 2021-07-18 MED ORDER — APIXABAN 5 MG PO TABS
5.0000 mg | ORAL_TABLET | Freq: Two times a day (BID) | ORAL | Status: DC
  Administered 2021-07-18 – 2021-07-19 (×2): 5 mg via ORAL
  Filled 2021-07-18 (×2): qty 1

## 2021-07-18 MED ORDER — METOCLOPRAMIDE HCL 5 MG/ML IJ SOLN
10.0000 mg | Freq: Four times a day (QID) | INTRAMUSCULAR | Status: AC
  Administered 2021-07-18 – 2021-07-19 (×3): 10 mg via INTRAVENOUS
  Filled 2021-07-18 (×3): qty 2

## 2021-07-18 MED ORDER — HYDROCHLOROTHIAZIDE 25 MG PO TABS
25.0000 mg | ORAL_TABLET | Freq: Every day | ORAL | Status: DC
  Administered 2021-07-19: 25 mg via ORAL
  Filled 2021-07-18: qty 1

## 2021-07-18 NOTE — Hospital Course (Addendum)
HPI: This is a 46 year old woman recently diagnosed with a DVT in her left leg.  She been having swelling for several months and then got worse.  Diagnosis was made with ultrasound.  She had a CT of the chest to rule out pulmonary embolus.  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  On PET/CT there was low-grade metabolic activity.  I saw her in September.  She was about 6 weeks out from initiating anticoagulation with apixaban.  We discussed surgical removal but wanted to wait until she had been on treatment for at least 3 months.  She is feeling well.  She is not having any issues with shortness of breath.  She does have some occasional swelling in her left leg.  She had a repeat duplex which showed no clot in the common femoral.  There was incomplete resolution of the thrombus in her left femoral-popliteal veins.  Dr. Roxan Hockey discussed the need for robotic assisted right VATS, wedge, intercostal nerve block. Potential risks, benefits, and complications of the surgery were discussed with the patient and she agreed to proceed with surgery.  Hospital Course: Patient underwent a right VATS, wedge RLL, intercostal nerve block ribs 3 through 10 on 07/17/2021. She was extubated and transferred from the OR to PACU in stable condition. Chest tube had scant output. Chest tube was to water seal and there was no air leak. CXR showed possible trace right apical pneumothorax. Chest tube was removed on post op day one. Same day follow up CXR showed small right apical pneumothorax. Foley was removed on post op day one and IVF were stopped. She was restarted on Apixaban (history of DVT) the evening of 01/12. She was ambulating  on room air. All wounds are clean, dry, healing without signs of infection. She is tolerating a diet. PA;LAT CXR done 01/13 showed **. She is felt surgically stable for discharge.

## 2021-07-18 NOTE — Op Note (Signed)
NAMESHYLYN, YOUNCE MEDICAL RECORD NO: [id number] ACCOUNT NO: [id number] DATE OF BIRTH: 01-Jan-1976 FACILITY: [facility] LOCATION: [facility] PHYSICIAN: Revonda Standard. Roxan Hockey, MD  Operative Report   DATE OF PROCEDURE: 07/17/2021  DATE OF SURGERY:  07/17/2021.  PREOPERATIVE DIAGNOSIS:  Pleural based mass, right chest.  POSTOPERATIVE DIAGNOSIS:  Right lower lobe lung nodule.  PROCEDURE:   Xi robotic-assisted right video-assisted thoracoscopy,  Wedge resection right lower lobe nodule Intercostal nerve blocks at levels 3 through 10.  SURGEON:  Modesto Charon, MD  ASSISTANT:  Enid Cutter, PA-C.  ANESTHESIA:  General.  FINDINGS:  Small nodule in the posterolateral base of right lower lobe.  Frozen section showed fibrosis and inflammation.  No evidence of malignancy.  Fatty tissue in the adjacent diaphragm.  INDICATIONS:  Ms. Baccari is a 46 year old woman with a history of a DVT.  A CT of the chest was done to rule out pulmonary embolus.  There was no pulmonary embolus, but there was a pleural based mass at the right lung base.  It was unclear if this was  arising from the chest wall, diaphragm or the lung.  On PET/CT, there was a low-grade metabolic activity. She was advised to undergo surgical resection, but with delay to allow 3 months from initiation of treatment for her deep venous thrombosis. The  indications, risks, benefits and alternatives were discussed in detail with the patient.  She understood and accepted the risks and agreed to proceed.  OPERATIVE NOTE:  Ms. Artz was brought to the preoperative holding area on 07/17/2021. Anesthesia established intravenous access. The patient was taken to the operating room and anesthetized and intubated with a double lumen endotracheal tube.   Intravenous antibiotics were administered.  A Foley catheter was placed.  Sequential compression devices were placed on the calves for DVT prophylaxis.  She was placed in a left lateral  decubitus position.  A Bair Hugger was placed for active warming.  The right chest was prepped and draped in the usual sterile fashion.  Single lung ventilation of the left lung was initiated and was tolerated well throughout the procedure.  Timeout was performed.  A solution containing 20 mL of liposomal bupivacaine, 30 mL of 0.5% bupivacaine and 50 mL of saline was prepared.  This was used for local at the incision sites as well as for the intercostal nerve blocks. An incision was made in  the eighth interspace in the midaxillary line and the chest was entered bluntly using a hemostat.  An 8 mm robotic port was placed through the incision into the chest.  The thoracoscope was advanced into the chest. After confirming  intrapleural placement, carbon dioxide was insufflated per protocol.  Two additional 8 mm ports were placed, one anterior and one posterior to the camera port, a 12 mm AirSeal port was placed posterolaterally.  Intercostal nerve blocks were performed from the 3rd to the 10th interspace injecting 10 ml of the bupivacaine solution into a subpleural plane at each level.  Inspection of the diaphragm revealed some  fatty tissue.  There was no abnormality of the chest wall.  There was a small nodule along the posterolateral inferior margin of the right lower lobe, which correlated with the finding on CT. The anterior port was exchanged for a 12 mm port. A robotic  stapler then was used to perform a wedge resection of the right lower lobe lung nodule with sequential firings of the robotic stapler.  The specimen was placed into an 8 mm endoscopic retrieval bag, removed,  and sent for frozen section.  On frozen  section, no tumor was seen.  There was inflammation and fibrosis. While awaiting frozen section, inspection of the diaphragm revealed fatty tissue posterolaterally.  This was excised with bipolar cautery and sent for permanent pathology.  The staple line  was inspected.  There was good  hemostasis.  The robot was undocked.  A 28-French Blake drain was placed through the original port incision and directed to the apex.  It was secured with a #1 silk suture.  Dual lung ventilation was resumed.  The remaining incisions were closed in standard fashion.  Dermabond was applied.  The chest tube  was placed to a Pleur-Evac on waterseal.  All sponge, needle and instrument counts were correct at the end of the procedure.  The patient was placed back in a supine position.  She was extubated in the operating room and taken to the postanesthetic care unit in good condition.  Experienced assistance was necessary for this case due to surgical complexity.  Enid Cutter served as Environmental consultant for this case, assisting with port placement, instrument exchange, specimen removal and wound closure.   MUK D: 07/17/2021 1:21:14 pm T: 07/18/2021 12:57:00 am  JOB: [id number]/ [id number]

## 2021-07-18 NOTE — TOC Initial Note (Signed)
Transition of Care [facility]) - Initial/Assessment Note    Patient Details  Name: Kristine Kirby MRN: [id number] Date of Birth: 17-Apr-1976  Transition of Care [facility]) CM/SW Contact:    Angelita Ingles, RN Phone Number:[phone]  07/18/2021, 2:20 PM  Clinical Narrative:                 [facility] consulted for patient with new start of Eliquis. CM at bedside and patient verbalized that she has been on Eliquis under the care of her medical provider and that she is aware of copay and has no needs related to this medication. Patient states that she has no TOC needs at this time. She functions independently at home with no DME or home health needs. There are no needs at this time TOC will sign off. If needs arise please enter consult.   Expected Discharge Plan: Home/Self Care Barriers to Discharge: Continued Medical Work up   Patient Goals and CMS Choice Patient states their goals for this hospitalization and ongoing recovery are:: Wants to get better to go home CMS Medicare.gov Compare Post Acute Care list provided to::  (n/a) Choice offered to / list presented to : NA  Expected Discharge Plan and Services Expected Discharge Plan: Home/Self Care In-house Referral: NA Discharge Planning Services: NA Post Acute Care Choice: NA Living arrangements for the past 2 months: Apartment                 DME Arranged: N/A DME Agency: NA       HH Arranged: NA HH Agency: NA        Prior Living Arrangements/Services Living arrangements for the past 2 months: Apartment Lives with:: Self Patient language and need for interpreter reviewed:: Yes Do you feel safe going back to the place where you live?: Yes      Need for Family Participation in Patient Care: No (Comment) Care giver support system in place?: Yes (comment) Current home services:  (n/a) Criminal Activity/Legal Involvement Pertinent to Current Situation/Hospitalization: No - Comment as needed  Activities of Daily Living Home Assistive  Devices/Equipment: Eyeglasses, Blood pressure cuff ADL Screening (condition at time of admission) Patient's cognitive ability adequate to safely complete daily activities?: Yes Is the patient deaf or have difficulty hearing?: No Does the patient have difficulty seeing, even when wearing glasses/contacts?: No Does the patient have difficulty concentrating, remembering, or making decisions?: No Patient able to express need for assistance with ADLs?: Yes Does the patient have difficulty dressing or bathing?: No Independently performs ADLs?: Yes (appropriate for developmental age) Does the patient have difficulty walking or climbing stairs?: No Weakness of Legs: None Weakness of Arms/Hands: None  Permission Sought/Granted   Permission granted to share information with : No              Emotional Assessment Appearance:: Appears stated age Attitude/Demeanor/Rapport: Gracious Affect (typically observed): Pleasant Orientation: : Oriented to Self, Oriented to Place, Oriented to  Time, Oriented to Situation Alcohol / Substance Use: Not Applicable Psych Involvement: No (comment)  Admission diagnosis:  S/P thoracotomy [Z98.890] Patient Active Problem List   Diagnosis Date Noted   S/P thoracotomy 07/17/2021   Vitamin D deficiency 06/11/2021   Lung mass 06/11/2021   Hypertension 06/11/2021   Hypercholesterolemia 06/11/2021   Hardening of the aorta (main artery of the heart) ([place]) 06/11/2021   Acute embolism and thrombosis of unspecified deep veins of unspecified lower extremity ([place]) 06/11/2021   Irregular intermenstrual bleeding 04/04/2021   

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
## 2021-07-18 NOTE — Plan of Care (Signed)
°  Problem: Education: °Goal: Knowledge of disease or condition will improve °Outcome: Progressing °Goal: Knowledge of the prescribed therapeutic regimen will improve °Outcome: Progressing °  °Problem: Activity: °Goal: Risk for activity intolerance will decrease °Outcome: Progressing °  °Problem: Cardiac: °Goal: Will achieve and/or maintain hemodynamic stability °Outcome: Progressing °  °Problem: Clinical Measurements: °Goal: Postoperative complications will be avoided or minimized °Outcome: Progressing °  °Problem: Respiratory: °Goal: Respiratory status will improve °Outcome: Progressing °  °Problem: Pain Management: °Goal: Pain level will decrease °Outcome: Progressing °  °Problem: Skin Integrity: °Goal: Wound healing without signs and symptoms infection will improve °Outcome: Progressing °  °

## 2021-07-18 NOTE — Progress Notes (Addendum)
° °   °  [address]       [place] [address]             [phone]       1 Day Post-Op Procedure(s) (LRB): XI ROBOTIC ASSISTED RESECTION OF PLEURAL TUMOR (Right) INTERCOSTAL NERVE BLOCK (Right)  Subjective: Patient had nausea and vomiting yesterday. She feels better this am and is going to try and eat. She has pain on right side and into shoulder (from surgery).  Objective: Vital signs in last 24 hours: Temp:  [97.6 F (36.4 C)-98.9 F (37.2 C)] 98.9 F (37.2 C) (01/12 0309) Pulse Rate:  [68-138] 75 (01/12 0309) Cardiac Rhythm: Normal sinus rhythm (01/11 1903) Resp:  [12-33] 19 (01/12 0309) BP: (113-187)/(64-119) 113/64 (01/12 0309) SpO2:  [96 %-100 %] 97 % (01/12 0309)      Intake/Output from previous day: 01/11 0701 - 01/12 0700 In: 2607.2 [P.O.:240; I.V.:1967.2; IV Piggyback:400] Out: 2237 [Urine:2220; Blood:5; Chest Tube:12]   Physical Exam:  Cardiovascular: RRR Pulmonary: Clear to auscultation bilaterally Abdomen: Soft, non tender, bowel sounds present. Extremities: SCDs in place Wounds: Clean and dry.  No erythema or signs of infection. Chest Tube: to water seal, no air leak  Lab Results: CBC: Recent Labs    07/15/21 1042 07/18/21 0142  WBC 3.7* 7.1  HGB 12.2 11.6*  HCT 36.3 34.0*  PLT 203 192   BMET:  Recent Labs    07/15/21 1042 07/18/21 0142  NA 132* 134*  K 3.9 3.5  CL 105 103  CO2 21* 21*  GLUCOSE 95 135*  BUN 20 10  CREATININE 0.74 0.79  CALCIUM 8.3* 8.3*    PT/INR:  Recent Labs    07/15/21 1042  LABPROT 13.4  INR 1.0   ABG:  INR: Will add last result for INR, ABG once components are confirmed Will add last 4 CBG results once components are confirmed  Assessment/Plan:  1. CV - SR with first degree heart block. Will resume HCTZ and Apixaban soon 2.  Pulmonary - On room air. Chest tube with scant output last 24 hours. Chest tube is to water seal, no air leak. CXR this am appears stable. Hope to remove  chest tube. Await final pathology. Encourage incentive spirometer 3. Expected post op blood loss anemia-H and H this am slightly decreased to 11.6 and 34 4. Supplement potassium 5. Lovenox for DVT prophylaxis 6. GI-will give a few doses of scheduled Reglan, continue Zofran PRN.  7. Remove foley, decrease IVF   Donielle M ZimmermanPA-C 07/18/2021,7:17 AM [phone]   Patient seen and examined, agree with above Shoulder pain likely from chest tube DC chest tube- no air leak and minimal drainage Restart Eliquis tonight Ambulate  Remo Lipps C. Roxan Hockey, MD [facility] [phone]

## 2021-07-18 NOTE — TOC Benefit Eligibility Note (Signed)
Patient Teacher, English as a foreign language completed.    The patient is currently admitted and upon discharge could be taking Eliquis 5 mg.  The current 30 day co-pay is, $30.00.   The patient is insured through [place], [place] Patient [place] Patient Advocate Team Direct Number: [phone]  Fax: [phone]

## 2021-07-18 NOTE — TOC Progression Note (Addendum)
Transition of Care [facility]) - Progression Note    Patient Details  Name: Kristine Kirby MRN: [id number] Date of Birth: 18-Jul-1975  Transition of Care [facility]) CM/SW [place], RN Phone Number:[phone]  07/18/2021, 11:59 AM  Clinical Narrative:    New Eliquis orders. Benefits check requested for patient to go home on new Eliquis script.  1200 CM at bedside to make patient aware of Eliquis copay. Patient states that she has been on Eliquis and is aware of the copay and that the copay is affordable for her.         Expected Discharge Plan and Services                                                 Social Determinants of Health (SDOH) Interventions    Readmission Risk Interventions No flowsheet data found.

## 2021-07-19 ENCOUNTER — Inpatient Hospital Stay (HOSPITAL_COMMUNITY): Payer: BC Managed Care – PPO

## 2021-07-19 LAB — CBC
HCT: 33.3 % — ABNORMAL LOW (ref 36.0–46.0)
Hemoglobin: 11.1 g/dL — ABNORMAL LOW (ref 12.0–15.0)
MCH: 29.2 pg (ref 26.0–34.0)
MCHC: 33.3 g/dL (ref 30.0–36.0)
MCV: 87.6 fL (ref 80.0–100.0)
Platelets: 188 10*3/uL (ref 150–400)
RBC: 3.8 MIL/uL — ABNORMAL LOW (ref 3.87–5.11)
RDW: 12.9 % (ref 11.5–15.5)
WBC: 5.9 10*3/uL (ref 4.0–10.5)
nRBC: 0 % (ref 0.0–0.2)

## 2021-07-19 LAB — COMPREHENSIVE METABOLIC PANEL
ALT: 23 U/L (ref 0–44)
AST: 26 U/L (ref 15–41)
Albumin: 3 g/dL — ABNORMAL LOW (ref 3.5–5.0)
Alkaline Phosphatase: 36 U/L — ABNORMAL LOW (ref 38–126)
Anion gap: 4 — ABNORMAL LOW (ref 5–15)
BUN: 13 mg/dL (ref 6–20)
CO2: 22 mmol/L (ref 22–32)
Calcium: 7.9 mg/dL — ABNORMAL LOW (ref 8.9–10.3)
Chloride: 109 mmol/L (ref 98–111)
Creatinine, Ser: 0.81 mg/dL (ref 0.44–1.00)
GFR, Estimated: >60 mL/min (ref >60)
Glucose, Bld: 84 mg/dL (ref 70–99)
Potassium: 4.1 mmol/L (ref 3.5–5.1)
Sodium: 135 mmol/L (ref 135–145)
Total Bilirubin: 1.5 mg/dL — ABNORMAL HIGH (ref 0.3–1.2)
Total Protein: 5.5 g/dL — ABNORMAL LOW (ref 6.5–8.1)

## 2021-07-19 MED ORDER — TRAMADOL HCL 50 MG PO TABS
50.0000 mg | ORAL_TABLET | Freq: Four times a day (QID) | ORAL | 0 refills | Status: AC | PRN
Start: 2021-07-19 — End: 2021-07-24

## 2021-07-19 NOTE — Progress Notes (Signed)
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) INTERCOSTAL NERVE BLOCK (Right) Subjective: No complaints this morning, feels well  Objective: Vital signs in last 24 hours: Temp:  [97.6 F (36.4 C)-98.2 F (36.8 C)] 97.8 F (36.6 C) (01/13 0732) Pulse Rate:  [60-84] 61 (01/13 0351) Cardiac Rhythm: Normal sinus rhythm (01/12 1950) Resp:  [13-28] 19 (01/13 0732) BP: (124-173)/(71-90) 173/90 (01/13 0732) SpO2:  [96 %-99 %] 97 % (01/13 0732)  Hemodynamic parameters for last 24 hours:    Intake/Output from previous day: 01/12 0701 - 01/13 0700 In: 824.3 [P.O.:360; I.V.:464.3] Out: 400 [Urine:400] Intake/Output this shift: No intake/output data recorded.  General appearance: alert, cooperative, and no distress Neurologic: intact Heart: regular rate and rhythm Lungs: clear to auscultation bilaterally  Lab Results: Recent Labs    07/18/21 0142 07/19/21 0116  WBC 7.1 5.9  HGB 11.6* 11.1*  HCT 34.0* 33.3*  PLT 192 188   BMET:  Recent Labs    07/18/21 0142 07/19/21 0116  NA 134* 135  K 3.5 4.1  CL 103 109  CO2 21* 22  GLUCOSE 135* 84  BUN 10 13  CREATININE 0.79 0.81  CALCIUM 8.3* 7.9*    PT/INR: No results for input(s): LABPROT, INR in the last 72 hours. ABG    Component Value Date/Time   PHART 7.426 07/15/2021 1118   HCO3 22.4 07/15/2021 1118   ACIDBASEDEF 1.4 07/15/2021 1118   O2SAT 98.9 07/15/2021 1118   CBG (last 3)  No results for input(s): GLUCAP in the last 72 hours.  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

## 2021-07-19 NOTE — Progress Notes (Signed)
Patient provided with verbal discharge instructions. Paper copy of discharge provided to patient. RN answered all questions. VSS at discharge. IV removed. Patient belongings sent with patient. Patient dc'd via wheelchair to private vehicle  

## 2021-07-22 LAB — BPAM RBC
Blood Product Expiration Date: 202302032359
Blood Product Expiration Date: 202302062359
Unit Type and Rh: 6200
Unit Type and Rh: 6200

## 2021-07-22 LAB — TYPE AND SCREEN
ABO/RH(D): A POS
Antibody Screen: NEGATIVE
Unit division: 0
Unit division: 0

## 2021-07-30 ENCOUNTER — Other Ambulatory Visit: Payer: Self-pay | Admitting: Cardiology

## 2021-08-01 ENCOUNTER — Other Ambulatory Visit: Payer: Self-pay

## 2021-08-01 ENCOUNTER — Ambulatory Visit (INDEPENDENT_AMBULATORY_CARE_PROVIDER_SITE_OTHER): Payer: Self-pay

## 2021-08-01 DIAGNOSIS — Z4802 Encounter for removal of sutures: Secondary | ICD-10-CM

## 2021-08-01 NOTE — Progress Notes (Signed)
Patient arrived for nurse visit to remove sutures post- procedure RATS resection of tumor with Dr. Roxan Hockey.  One Sutures removed with no signs/ symptoms of infection noted.  Patient tolerated procedure well.  Patient instructed to keep the incision sites clean and dry.  Patient acknowledged instructions given.

## 2021-08-12 ENCOUNTER — Other Ambulatory Visit: Payer: Self-pay | Admitting: Thoracic Surgery (Cardiothoracic Vascular Surgery)

## 2021-08-12 DIAGNOSIS — Z9889 Other specified postprocedural states: Secondary | ICD-10-CM

## 2021-08-13 ENCOUNTER — Encounter: Payer: Self-pay | Admitting: Thoracic Surgery (Cardiothoracic Vascular Surgery)

## 2021-08-13 ENCOUNTER — Ambulatory Visit (INDEPENDENT_AMBULATORY_CARE_PROVIDER_SITE_OTHER): Payer: Self-pay | Admitting: Thoracic Surgery (Cardiothoracic Vascular Surgery)

## 2021-08-13 ENCOUNTER — Ambulatory Visit
Admission: RE | Admit: 2021-08-13 | Discharge: 2021-08-13 | Disposition: A | Discharge status: Home / Self Care | Payer: BC Managed Care – PPO | Source: Ambulatory Visit | Attending: Thoracic Surgery (Cardiothoracic Vascular Surgery) | Admitting: Thoracic Surgery (Cardiothoracic Vascular Surgery)

## 2021-08-13 ENCOUNTER — Other Ambulatory Visit: Payer: Self-pay

## 2021-08-13 VITALS — BP 114/79 | HR 63 | Resp 20 | Ht 65.0 in | Wt 158.0 lb

## 2021-08-13 DIAGNOSIS — Z9889 Other specified postprocedural states: Secondary | ICD-10-CM

## 2021-08-13 DIAGNOSIS — D492 Neoplasm of unspecified behavior of bone, soft tissue, and skin: Secondary | ICD-10-CM

## 2021-08-13 NOTE — Progress Notes (Signed)
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  A CT of the chest was done to rule out pulmonary embolus.  There was no PE but there was a 3.1 x 2.7 x 3.7 cm pleural-based or subpleural nodule at the right base.  On PET/CT there was low-grade metabolic activity.  We wanted to wait till 3 months from her initiation of anticoagulation before proceeding with surgical resection.  I did a wedge resection on 07/17/2021.  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  She did well postoperatively and went home on day 2.  She continues to do well.  She did use pain medication for several days after going home but was having some bad dreams and stopped that.  She then was just using Tylenol and she has not even had to take that for the past week.  Overall she feels well but she does tire easily and does not have much stamina.  No swelling in her legs or shortness of breath.  Past Medical History:  Diagnosis Date   Complication of anesthesia 2001   vomited during wisdom tooth extraction.   DVT of deep femoral vein, left (HCC)    Dysmenorrhea    Hypertension    Lung nodule 03/07/2021   Resected January 2023.  Benign.  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   Numbness and tingling in right hand      Current Outpatient Medications  Medication Sig Dispense Refill   ELIQUIS 5 MG TABS tablet TAKE 1 TABLET BY MOUTH TWICE A DAY 60 tablet 0   hydrochlorothiazide (HYDRODIURIL) 25 MG tablet Take 25 mg by mouth daily.     valACYclovir (VALTREX) 1000 MG tablet Take 1,000 mg by mouth 2 (two) times daily as needed (outbreaks).     No current facility-administered medications for this visit.    Physical Exam BP 114/79 (BP Location: Right Arm, Patient Position: Sitting)    Pulse 63    Resp  20    Ht 5\' 5"  (1.651 m)    Wt 158 lb (71.7 kg)    SpO2 99% Comment: RA   BMI 26.26 kg/m  46 year old woman in no acute distress Alert and oriented x3 with no focal deficits Lungs clear with equal breath sounds bilaterally Incisions well-healed No peripheral edema  Diagnostic Tests: I personally reviewed her chest x-ray.  Staple line visible from wedge resection, otherwise normal appearance.  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  A CT of the chest to rule out PE showed a lung versus pleural-based mass at the right base.  There was no evidence of PE on the scan.  However resection of the mass revealed it to be scarring from a pulmonary infarct.  She is doing exceptionally well.  Minimal discomfort.  No shortness of breath.  She is still recovering and does not have much stamina yet but that should improve dramatically over the next 3 to 4 weeks.  There are no restrictions on her activities but she was cautioned to build into new activities gradually.  She will plan to return to work at the originally scheduled date.  No follow-up necessary.  Plan: Follow-up with Dr. Sabra Heck and Terri Skains. I will be happy to see Kristine Kirby back anytime in the future if I can be of any further assistance with her care  Kristine Nakayama, MD [facility] [phone]

## 2021-09-05 ENCOUNTER — Other Ambulatory Visit: Payer: Self-pay

## 2021-09-05 ENCOUNTER — Ambulatory Visit (HOSPITAL_COMMUNITY)
Admission: RE | Admit: 2021-09-05 | Discharge: 2021-09-05 | Disposition: A | Discharge status: Home / Self Care | Payer: BC Managed Care – PPO | Source: Ambulatory Visit | Attending: Cardiology | Admitting: Cardiology

## 2021-09-05 DIAGNOSIS — I82512 Chronic embolism and thrombosis of left femoral vein: Secondary | ICD-10-CM | POA: Insufficient documentation

## 2021-09-18 ENCOUNTER — Other Ambulatory Visit: Payer: Self-pay | Admitting: Cardiology

## 2021-09-24 ENCOUNTER — Other Ambulatory Visit: Payer: Self-pay

## 2021-09-24 ENCOUNTER — Ambulatory Visit: Payer: BC Managed Care – PPO | Admitting: Cardiology

## 2021-09-24 ENCOUNTER — Encounter: Payer: Self-pay | Admitting: Cardiology

## 2021-09-24 VITALS — BP 122/65 | HR 70 | Temp 97.2°F | Resp 16 | Ht 65.0 in | Wt 158.0 lb

## 2021-09-24 DIAGNOSIS — I82512 Chronic embolism and thrombosis of left femoral vein: Secondary | ICD-10-CM

## 2021-09-24 DIAGNOSIS — I10 Essential (primary) hypertension: Secondary | ICD-10-CM

## 2021-09-24 DIAGNOSIS — Z7901 Long term (current) use of anticoagulants: Secondary | ICD-10-CM

## 2021-09-24 NOTE — Progress Notes (Signed)
? ?ID:  Kristine Kirby, DOB [birth year], MRN [id number] ? ?PCP:  Kathyrn Lass, MD  ?Cardiologist:  Rex Kras, DO, [facility]  (established care 02/14/2021) ? ?Date: 09/24/21 ?Last Office Visit: 06/26/2021 ? ?Chief Complaint  ?Patient presents with  ? Results  ? Follow-up  ? ? ?HPI  ?Kristine Kirby is a 46 y.o. female whose past medical history and cardiovascular risk factors include: Hypertension, hx of left femoral-popliteal DVT, chronic left femoral vein DVT,  right lower lobe lesion 3 x 1.9 cm (CT PE protocol 02/2021, PET scan 8/30 2022), aortic atherosclerosis (PET scan 02/2021). ? ?Patient was referred to the practice for evaluation of chest pain which had resolved after up titration of antihypertensive medications.  During subsequent visit was noted to have asymmetrical lower extremity swelling underwent additional work-up and was noted to have acute left femoral-popliteal DVT and started on Eliquis.  She also underwent a CT PE protocol to rule out pulmonary embolism given the elevated D-dimers.  She was noted to have a right-sided pleural-based well circumcised mass.  She underwent further evaluation with medical oncology and cardiothoracic surgery. ? ?She underwent wedge resection on July 17, 2021 and based on pathology report it appears to be scarring related to prior pulmonary infarct likely due to subclinical PE.  She underwent a repeat lower extremity venous duplex and is noted to have chronic left mid femoral vein DVT partial compressibility, softly, echogenic.  ? ?Cardiovascular standpoint patient is asymptomatic.  She denies angina pectoris or heart failure symptoms.  She has been under a lot of stress given the loss of 2 close family members and also returning back to work after her recent surgery. ? ?FUNCTIONAL STATUS: ?3-4 walking for about 3 miles each time.   ? ?ALLERGIES: ?Allergies  ?Allergen Reactions  ? Codeine Nausea And Vomiting  ?  Severe nausea and vomiting  ? ? ?MEDICATION LIST PRIOR TO  VISIT: ?Current Meds  ?Medication Sig  ? ELIQUIS 5 MG TABS tablet TAKE 1 TABLET BY MOUTH TWICE A DAY  ? hydrochlorothiazide (HYDRODIURIL) 25 MG tablet Take 25 mg by mouth daily.  ? valACYclovir (VALTREX) 1000 MG tablet Take 1,000 mg by mouth 2 (two) times daily as needed (outbreaks).  ?  ? ?PAST MEDICAL HISTORY: ?Past Medical History:  ?Diagnosis Date  ? Complication of anesthesia 2001  ? vomited during wisdom tooth extraction.  ? DVT of deep femoral vein, left ([facility])   ? Dysmenorrhea   ? Hypertension   ? Lung nodule 03/07/2021  ? Resected January 2023.  Benign.  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  ? Numbness and tingling in right hand   ? ? ?PAST SURGICAL HISTORY: ?Past Surgical History:  ?Procedure Laterality Date  ? INTERCOSTAL NERVE BLOCK Right 07/17/2021  ? Procedure: INTERCOSTAL NERVE BLOCK;  Surgeon: Melrose Nakayama, MD;  Location: [facility];  Service: Thoracic;  Laterality: Right;  ? MOUTH SURGERY    ? None    ? ? ?FAMILY HISTORY: ?The patient family history includes Diabetes in her mother; Hypertension in her mother. ? ?SOCIAL HISTORY:  ?The patient  reports that she has never smoked. She has never used smokeless tobacco. She reports that she does not drink alcohol and does not use drugs. ? ?REVIEW OF SYSTEMS: ?Review of Systems  ?Cardiovascular:  Positive for leg swelling (Improving). Negative for chest pain, claudication, dyspnea on exertion, near-syncope, orthopnea, palpitations, paroxysmal nocturnal dyspnea and syncope.  ?Respiratory:  Negative for shortness of breath.   ? ?PHYSICAL EXAM: ?Vitals with BMI 09/24/2021 08/13/2021  07/19/2021  ?Height '5\' 5"'$  '5\' 5"'$  -  ?Weight 158 lbs 158 lbs -  ?BMI 26.29 26.29 -  ?Systolic 063 016 010  ?Diastolic 65 79 90  ?Pulse 70 63 -  ? ? ?CONSTITUTIONAL: Well-developed and well-nourished. No acute distress.  ?SKIN: Skin is warm and dry. No rash noted. No cyanosis. No pallor. No jaundice ?HEAD: Normocephalic and atraumatic.  ?EYES: No scleral icterus ?MOUTH/THROAT: Moist oral  membranes.  ?NECK: No JVD present. No thyromegaly noted. No carotid bruits  ?LYMPHATIC: No visible cervical adenopathy.  ?CHEST Normal respiratory effort. No intercostal retractions  ?LUNGS: Clear to auscultation bilaterally.  No stridor. No wheezes. No rales.  ?CARDIOVASCULAR: Regular, positive S1-S2, no murmurs rubs or gallops appreciated. ?ABDOMINAL: Nonobese, soft, nontender, nondistended, positive bowel sounds in all 4 quadrants no apparent ascites.  ?EXTREMITIES: Bilateral  trace peripheral edema.  Warm to touch bilaterally.  2+ PT pulses bilaterally.  Compression stockings present. ?HEMATOLOGIC: No significant bruising ?NEUROLOGIC: Oriented to person, place, and time. Nonfocal. Normal muscle tone.  ?PSYCHIATRIC: Normal mood and affect. Normal behavior. Cooperative ? ?CARDIAC DATABASE: ?EKG: ?02/12/2021: Sinus bradycardia, 54 bpm, consider old anteroseptal infarct, without underlying injury pattern.  ? ?Echocardiogram: ?No results found for this or any previous visit from the past 1095 days. ?  ?Stress Testing: ?No results found for this or any previous visit from the past 1095 days. ? ?Heart Catheterization: ?None ? ?Lower extremity venous duplex: ?02/20/2021: ?Acute left femoropopliteal DVT ? ?US venous duplex: ?06/05/2021 ?1. Incomplete resolution with residual LEFT femoropopliteal DVT involving the femoral and popliteal veins. ?2. The LEFT common femoral vein is patent on today's evaluation, previously with thrombus. ? ?09/05/2021: ?RIGHT: No evidence of common femoral vein obstruction.  ?LEFT: Findings consistent with chronic deep vein thrombosis involving the left mid femoral vein. No cystic structure found in the popliteal fossa.  ? ?CT PE protocol: ?02/19/2021: ?Negative for significant acute pulmonary embolus by CTA. 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 Recommend further evaluation with PET-CT. ? ?PET  scan: ?03/05/2021 ?1. The pleural-based right-sided thoracic lesion demonstrates low-level activity, similar to the mediastinal pool. 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 If not already performed, recommend multidisciplinary thoracic oncology consultation. Consider CT follow-up at 6 months. ?2.  Aortic Atherosclerosis (ICD10-I70.0). ? ?LABORATORY DATA: ?CBC Latest Ref Rng & Units 07/19/2021 07/18/2021 07/15/2021  ?WBC 4.0 - 10.5 K/uL 5.9 7.1 3.7(L)  ?Hemoglobin 12.0 - 15.0 g/dL 11.1(L) 11.6(L) 12.2  ?Hematocrit 36.0 - 46.0 % 33.3(L) 34.0(L) 36.3  ?Platelets 150 - 400 K/uL 188 192 203  ? ? ?CMP Latest Ref Rng & Units 07/19/2021 07/18/2021 07/15/2021  ?Glucose 70 - 99 mg/dL 84 135(H) 95  ?BUN 6 - 20 mg/dL '13 10 20  '$ ?Creatinine 0.44 - 1.00 mg/dL 0.81 0.79 0.74  ?Sodium 135 - 145 mmol/L 135 134(L) 132(L)  ?Potassium 3.5 - 5.1 mmol/L 4.1 3.5 3.9  ?Chloride 98 - 111 mmol/L 109 103 105  ?CO2 22 - 32 mmol/L 22 21(L) 21(L)  ?Calcium 8.9 - 10.3 mg/dL 7.9(L) 8.3(L) 8.3(L)  ?Total Protein 6.5 - 8.1 g/dL 5.5(L) - 6.4(L)  ?Total Bilirubin 0.3 - 1.2 mg/dL 1.5(H) - 0.9  ?Alkaline Phos 38 - 126 U/L 36(L) - 44  ?AST 15 - 41 U/L 26 - 25  ?ALT 0 - 44 U/L 23 - 25  ? ? ?Lipid Panel  ?No results found for: CHOL, TRIG, HDL, CHOLHDL,  VLDL, LDLCALC, LDLDIRECT, LABVLDL ? ?No components found for: NTPROBNP ?No results for input(s): PROBNP in the last 8760 hours. ?No results for input(s): TSH in the last 8760 hours. ? ?BMP ?Recent Labs  ?  07/15/21 ?1042 07/18/21 ?0142 07/19/21 ?0116  ?NA 132* 134* 135  ?K 3.9 3.5 4.1  ?CL 105 103 109  ?CO2 21* 21* 22  ?GLUCOSE 95 135* 84  ?BUN '20 10 13  '$ ?CREATININE 0.74 0.79 0.81  ?CALCIUM 8.3* 8.3* 7.9*  ?GFRNONAA >60 >60 >60  ? ? ?HEMOGLOBIN A1C ?No results found for: HGBA1C, MPG ? ?External Labs: ?Collected: 01/17/2021 provided by PCP ?Hemoglobin 11 g/dL, hematocrit 38.8%. ?Sodium 137, potassium 4, chloride 101, bicarb 27 ?BUN 15, creatinine 0.73 mg/dL. ?AST 12, ALT  12, alkaline phosphatase 45 ? ?IMPRESSION: ? ?  ICD-10-CM   ?1. Chronic deep vein thrombosis (DVT) of femoral vein of left lower extremity (HCC)  I82.512 Ambulatory referral to Hematology / Oncology  ?  VAS

## 2021-09-25 ENCOUNTER — Telehealth: Payer: Self-pay | Admitting: Hematology and Oncology

## 2021-09-25 NOTE — Telephone Encounter (Signed)
Scheduled appt per 3/21 referral. Pt is aware of appt date and time. Pt is aware to arrive 15 mins prior to appt time and to bring and updated insurance card. Pt is aware of appt location.   ?

## 2021-10-09 ENCOUNTER — Inpatient Hospital Stay: Payer: BC Managed Care – PPO | Attending: Hematology and Oncology | Admitting: Hematology and Oncology

## 2021-10-09 ENCOUNTER — Inpatient Hospital Stay: Payer: BC Managed Care – PPO

## 2021-10-09 ENCOUNTER — Other Ambulatory Visit: Payer: Self-pay

## 2021-10-09 VITALS — BP 118/61 | HR 76 | Temp 97.4°F | Resp 16 | Wt 161.1 lb

## 2021-10-09 DIAGNOSIS — I82532 Chronic embolism and thrombosis of left popliteal vein: Secondary | ICD-10-CM | POA: Diagnosis not present

## 2021-10-09 DIAGNOSIS — Z7901 Long term (current) use of anticoagulants: Secondary | ICD-10-CM | POA: Insufficient documentation

## 2021-10-09 DIAGNOSIS — Z86718 Personal history of other venous thrombosis and embolism: Secondary | ICD-10-CM | POA: Insufficient documentation

## 2021-10-09 DIAGNOSIS — Z86711 Personal history of pulmonary embolism: Secondary | ICD-10-CM | POA: Insufficient documentation

## 2021-10-09 LAB — CMP (CANCER CENTER ONLY)
ALT: 44 U/L (ref 0–44)
AST: 27 U/L (ref 15–41)
Albumin: 4 g/dL (ref 3.5–5.0)
Alkaline Phosphatase: 55 U/L (ref 38–126)
Anion gap: 6 (ref 5–15)
BUN: 12 mg/dL (ref 6–20)
CO2: 28 mmol/L (ref 22–32)
Calcium: 9.1 mg/dL (ref 8.9–10.3)
Chloride: 102 mmol/L (ref 98–111)
Creatinine: 0.88 mg/dL (ref 0.44–1.00)
GFR, Estimated: >60 mL/min (ref >60)
Glucose, Bld: 87 mg/dL (ref 70–99)
Potassium: 3.8 mmol/L (ref 3.5–5.1)
Sodium: 136 mmol/L (ref 135–145)
Total Bilirubin: 0.4 mg/dL (ref 0.3–1.2)
Total Protein: 7.7 g/dL (ref 6.5–8.1)

## 2021-10-09 LAB — CBC WITH DIFFERENTIAL (CANCER CENTER ONLY)
Abs Immature Granulocytes: 0.01 10*3/uL (ref 0.00–0.07)
Basophils Absolute: 0 10*3/uL (ref 0.0–0.1)
Basophils Relative: 0 %
Eosinophils Absolute: 0.1 10*3/uL (ref 0.0–0.5)
Eosinophils Relative: 2 %
HCT: 36.5 % (ref 36.0–46.0)
Hemoglobin: 12 g/dL (ref 12.0–15.0)
Immature Granulocytes: 0 %
Lymphocytes Relative: 44 %
Lymphs Abs: 2.4 10*3/uL (ref 0.7–4.0)
MCH: 29.1 pg (ref 26.0–34.0)
MCHC: 32.9 g/dL (ref 30.0–36.0)
MCV: 88.6 fL (ref 80.0–100.0)
Monocytes Absolute: 0.5 10*3/uL (ref 0.1–1.0)
Monocytes Relative: 10 %
Neutro Abs: 2.4 10*3/uL (ref 1.7–7.7)
Neutrophils Relative %: 44 %
Platelet Count: 242 10*3/uL (ref 150–400)
RBC: 4.12 MIL/uL (ref 3.87–5.11)
RDW: 13.6 % (ref 11.5–15.5)
Smear Review: NORMAL
WBC Count: 5.4 10*3/uL (ref 4.0–10.5)
nRBC: 0 % (ref 0.0–0.2)

## 2021-10-11 LAB — CARDIOLIPIN ANTIBODIES, IGG, IGM, IGA
Anticardiolipin IgA: <9 APL U/mL (ref 0–11)
Anticardiolipin IgG: <9 GPL U/mL (ref 0–14)
Anticardiolipin IgM: <9 MPL U/mL (ref 0–12)

## 2021-10-11 LAB — BETA-2-GLYCOPROTEIN I ABS, IGG/M/A
Beta-2 Glyco I IgG: <9 GPI IgG units (ref 0–20)
Beta-2-Glycoprotein I IgA: <9 GPI IgA units (ref 0–25)
Beta-2-Glycoprotein I IgM: <9 GPI IgM units (ref 0–32)

## 2021-10-15 NOTE — Progress Notes (Signed)
?[place] ?[phone]   [phone] ? ?INITIAL CONSULT NOTE ? ?Patient Care Team: ?Kathyrn Lass, MD as PCP - General (Family Medicine) ? ?Hematological/Oncological History ?# Left Lower Extremity DVT ?02/20/2021: [place] LE showed an acute left femoropopliteal DVT ?06/05/2021: [place] LE showed incomplete resolution with residual LEFT femoropopliteal DVT involving the femoral and popliteal veins ?09/05/2021: [place] LE performed which showed chronic DVT of the left mid femoral vein.  ?10/09/2021: establish care with Dr. Lorenso Courier  ? ?CHIEF COMPLAINTS/PURPOSE OF CONSULTATION:  ?"Left Lower Extremity DVT " ? ?HISTORY OF PRESENTING ILLNESS:  ?Kristine Kirby 46 y.o. female with medical history significant for hypertension who presents for evaluation of a chronic left lower extremity DVT. ? ?On review of the previous records Kristine Kirby was initially found to have an acute left femoral-popliteal DVT on 02/20/2021.  Subsequently she underwent imaging on 06/05/2021 which showed incomplete resolution of that left femoral-popliteal DVT.  Most recently on 09/05/2021 the patient had ultrasound performed of the lower extremity which showed a chronic DVT in the left mid femoral vein.  Due to concern for these findings the patient was referred to hematology for further evaluation management. ? 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  She notes that she is almost always on her feet and does tend to develop some lower extremity swelling.  She notes that she started Eliquis 10 mg twice daily in August and that the swelling and pain did go away.  She notes that the swelling has been mitigated and that her leg swelling has completely gone away.  She notes that she is tolerating Eliquis therapy well with no bleeding, bruising, or dark stools.  She notes that she was previously on birth control medication but has not been on birth control recently.  She has previously been on the  minipill but stopped this in December 2022.  She notes that she discontinued her estrogen-based birth control due to her DVT. ? ?On further discussion she notes that her maternal grandmother passed away at age 25 of a stroke and is on Coumadin.  Her mother has hypertension and she is unsure of her father side of the family.  She reports that she is a never smoker and only rarely drinks alcohol.  She is currently a fourth grade teacher.  She notes that she is not having any pain or discomfort of the left lower extremity at this time.  She otherwise denies any fevers, chills, sweats, nausea vomiting or diarrhea.  Full 10 point ROS is listed below ? ?MEDICAL HISTORY:  ?Past Medical History:  ?Diagnosis Date  ? Complication of anesthesia 2001  ? vomited during wisdom tooth extraction.  ? DVT of deep femoral vein, left ([place])   ? Dysmenorrhea   ? Hypertension   ? Lung nodule 03/07/2021  ? Resected January 2023.  Benign.  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  ? Numbness and tingling in right hand   ? ? ?SURGICAL HISTORY: ?Past Surgical History:  ?Procedure Laterality Date  ? INTERCOSTAL NERVE BLOCK Right 07/17/2021  ? Procedure: INTERCOSTAL NERVE BLOCK;  Surgeon: Melrose Nakayama, MD;  Location: [facility];  Service: Thoracic;  Laterality: Right;  ? MOUTH SURGERY    ? None    ? ? ?SOCIAL HISTORY: ?Social History  ? ?Socioeconomic History  ? Marital status: Single  ?  Spouse name: Not on file  ? Number of children: 0  ? Years of education: College  ?  Highest education level: Not on file  ?Occupational History  ?  Comment: Education officer, museum  ?Tobacco Use  ? Smoking status: Never  ? Smokeless tobacco: Never  ?Vaping Use  ? Vaping Use: Never used  ?Substance and Sexual Activity  ? Alcohol use: No  ?  Comment: occ  ? Drug use: No  ? Sexual activity: Not on file  ?Other Topics Concern  ? Not on file  ?Social History Narrative  ? Patient is a Education officer, museum full time.  ? EducationNurse, mental health.  ? Right handed.  ?   ?   ?   ? ?Social  Determinants of Health  ? ?Financial Resource Strain: Not on file  ?Food Insecurity: Not on file  ?Transportation Needs: Not on file  ?Physical Activity: Not on file  ?Stress: Not on file  ?Social Connections: Not on file  ?Intimate Partner Violence: Not on file  ? ? ?FAMILY HISTORY: ?Family History  ?Problem Relation Age of Onset  ? Hypertension Mother   ? Diabetes Mother   ? ? ?ALLERGIES:  is allergic to codeine. ? ?MEDICATIONS:  ?Current Outpatient Medications  ?Medication Sig Dispense Refill  ? amoxicillin-clavulanate (AUGMENTIN) 875-125 MG tablet SMARTSIG:1 Tablet(s) By Mouth Every 12 Hours    ? ELIQUIS 5 MG TABS tablet TAKE 1 TABLET BY MOUTH TWICE A DAY 60 tablet 0  ? hydrochlorothiazide (HYDRODIURIL) 25 MG tablet Take 25 mg by mouth daily.    ? valACYclovir (VALTREX) 1000 MG tablet Take 1,000 mg by mouth 2 (two) times daily as needed (outbreaks).    ? ?No current facility-administered medications for this visit.  ? ? ?REVIEW OF SYSTEMS:   ?Constitutional: ( - ) fevers, ( - )  chills , ( - ) night sweats ?Eyes: ( - ) blurriness of vision, ( - ) double vision, ( - ) watery eyes ?Ears, nose, mouth, throat, and face: ( - ) mucositis, ( - ) sore throat ?Respiratory: ( - ) cough, ( - ) dyspnea, ( - ) wheezes ?Cardiovascular: ( - ) palpitation, ( - ) chest discomfort, ( - ) lower extremity swelling ?Gastrointestinal:  ( - ) nausea, ( - ) heartburn, ( - ) change in bowel habits ?Skin: ( - ) abnormal skin rashes ?Lymphatics: ( - ) new lymphadenopathy, ( - ) easy bruising ?Neurological: ( - ) numbness, ( - ) tingling, ( - ) new weaknesses ?Behavioral/Psych: ( - ) mood change, ( - ) new changes  ?All other systems were reviewed with the patient and are negative. ? ?PHYSICAL EXAMINATION: ? ?Vitals:  ? 10/09/21 1342  ?BP: 118/61  ?Pulse: 76  ?Resp: 16  ?Temp: (!) 97.4 ?F (36.3 ?C)  ?SpO2: 95%  ? ?Filed Weights  ? 10/09/21 1342  ?Weight: 161 lb 1.6 oz (73.1 kg)  ? ? ?GENERAL: well appearing middle-aged African-American  female in NAD  ?SKIN: skin color, texture, turgor are normal, no rashes or significant lesions ?EYES: conjunctiva are pink and non-injected, sclera clear ?LUNGS: clear to auscultation and percussion with normal breathing effort ?HEART: regular rate & rhythm and no murmurs and no lower extremity edema ?Musculoskeletal: no cyanosis of digits and no clubbing  ?PSYCH: alert & oriented x 3, fluent speech ?NEURO: no focal motor/sensory deficits ? ?LABORATORY DATA:  ?I have reviewed the data as listed ? ?  Latest Ref Rng & Units 10/09/2021  ?  2:26 PM 07/19/2021  ?  1:16 AM 07/18/2021  ?  1:42 AM  ?CBC  ?WBC 4.0 - 10.5 K/uL  5.4   5.9   7.1    ?Hemoglobin 12.0 - 15.0 g/dL 12.0   11.1   11.6    ?Hematocrit 36.0 - 46.0 % 36.5   33.3   34.0    ?Platelets 150 - 400 K/uL 242   188   192    ? ? ? ?  Latest Ref Rng & Units 10/09/2021  ?  2:26 PM 07/19/2021  ?  1:16 AM 07/18/2021  ?  1:42 AM  ?CMP  ?Glucose 70 - 99 mg/dL 87   84   135    ?BUN 6 - 20 mg/dL '12   13   10    '$ ?Creatinine 0.44 - 1.00 mg/dL 0.88   0.81   0.79    ?Sodium 135 - 145 mmol/L 136   135   134    ?Potassium 3.5 - 5.1 mmol/L 3.8   4.1   3.5    ?Chloride 98 - 111 mmol/L 102   109   103    ?CO2 22 - 32 mmol/L '28   22   21    '$ ?Calcium 8.9 - 10.3 mg/dL 9.1   7.9   8.3    ?Total Protein 6.5 - 8.1 g/dL 7.7   5.5     ?Total Bilirubin 0.3 - 1.2 mg/dL 0.4   1.5     ?Alkaline Phos 38 - 126 U/L 55   36     ?AST 15 - 41 U/L 27   26     ?ALT 0 - 44 U/L 44   23     ? ? ?ASSESSMENT & PLAN ?Kristine Kirby 46 y.o. female with medical history significant for hypertension who presents for evaluation of a chronic left lower extremity DVT. ? ?After review of the labs, review of the records, and discussion with the patient the patients findings are most consistent with a provoked left lower extremity DVT which has turned into a chronic left lower extremity clot. ? ?A provoked venous thromboembolism (VTE) is one that has a clear inciting factor or event. Provoking factors include prolonged  travel/immobility, surgery (particularly abdominal or orthopedic), trauma,  and pregnancy/ estrogen containing birth control. This patient was reported to have been taking estrogen-based birth control, which would qu

## 2021-10-21 ENCOUNTER — Telehealth: Payer: Self-pay | Admitting: Hematology and Oncology

## 2021-10-21 ENCOUNTER — Telehealth: Payer: Self-pay | Admitting: *Deleted

## 2021-10-21 NOTE — Telephone Encounter (Signed)
TCT patient regarding her recent lab results.  Spoke with patient and advised that we did find any coagulation/clotting disorders. Dr. Lorenso Courier believes that the most likely cause for her blood clot was the estrogen based birth control. Advised that she can stop her Eliquis after she completes her current supply. Advised that she can if she experiences and new or worsening symptoms in her left leg, other wise we will see her in 6 months.  Pt voiced understanding. She is aware of her appt in 6 months. ?

## 2021-10-21 NOTE — Telephone Encounter (Signed)
-----   Message from Orson Slick, MD sent at 10/21/2021 11:16 AM EDT ----- ?Please let [name] know that her blood work showed no evidence of an underlying coagulation disorder.  The most likely cause of her blood clot was her estrogen-based birth control.  She can discontinue Eliquis after she completes her current supply.  Please have her call if she has any new or worsening symptoms of the left lower extremity.  We will plan to see her back in 6 months time unless she needs to be seen sooner. ?----- Message ----- ?From: Interface, [facility] In [place] ?Sent: 10/09/2021   2:55 PM EDT ?To: Orson Slick, MD ? ? ?

## 2021-10-21 NOTE — Telephone Encounter (Signed)
Per 4/17 in basket called and spoke to pt about upcoming appointment.  Pt confirmed appointment  ?

## 2021-10-27 ENCOUNTER — Other Ambulatory Visit: Payer: Self-pay | Admitting: Cardiology

## 2021-12-17 IMAGING — US US EXTREM LOW VENOUS*L*
1 series · 14 of 24 positions shown · non-contrast
Comparison: CTA PE, concurrent

CLINICAL DATA: Left leg swelling since September 2020. Positive
D-dimer.

EXAM:
LEFT LOWER EXTREMITY VENOUS DOPPLER ULTRASOUND
TECHNIQUE: Gray-scale sonography with compression, as well as color and duplex
ultrasound, were performed to evaluate the deep venous system(s)
from the level of the common femoral vein through the popliteal and
proximal calf veins.

[Series 1: us extrem low venous*left* · 0.07mm/px · 14 of 41 slices shown]
[im 1/41]
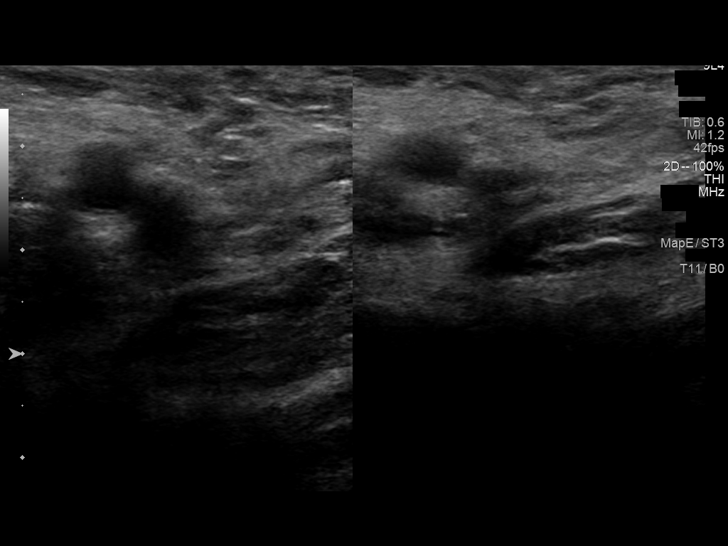
[im 4/41]
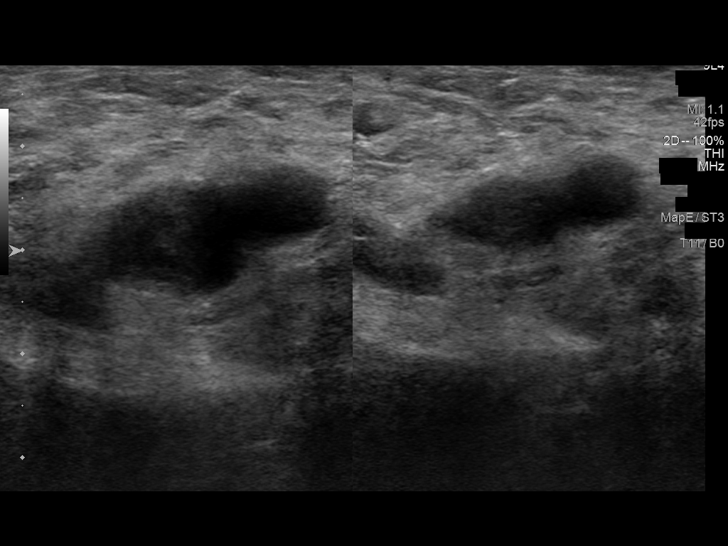
[im 7/41]
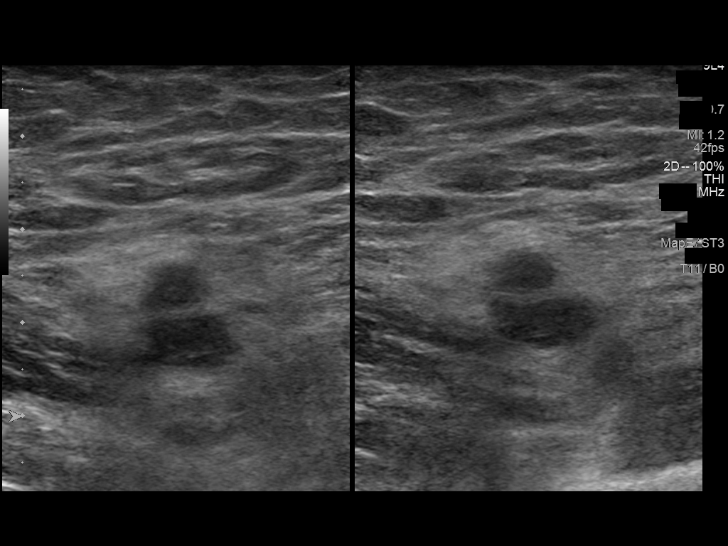
[im 11/41]
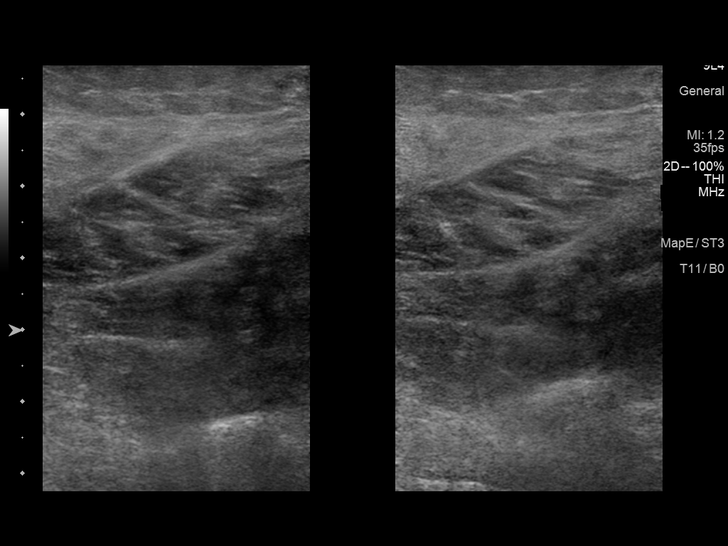
[im 13/41]
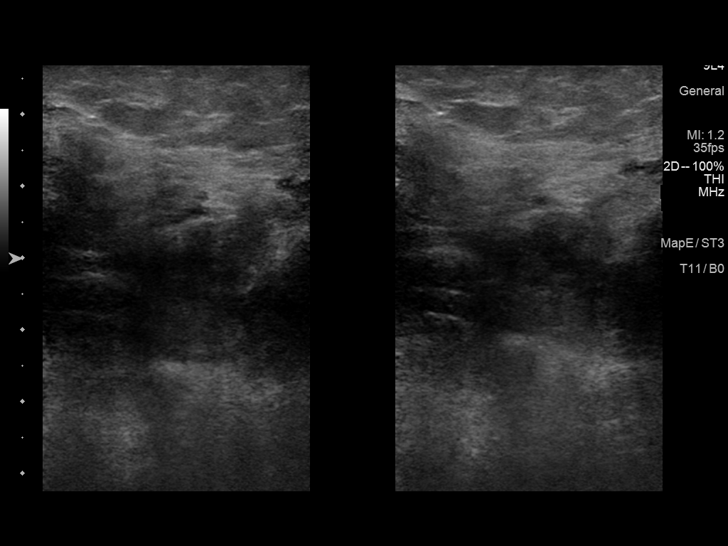
[im 16/41]
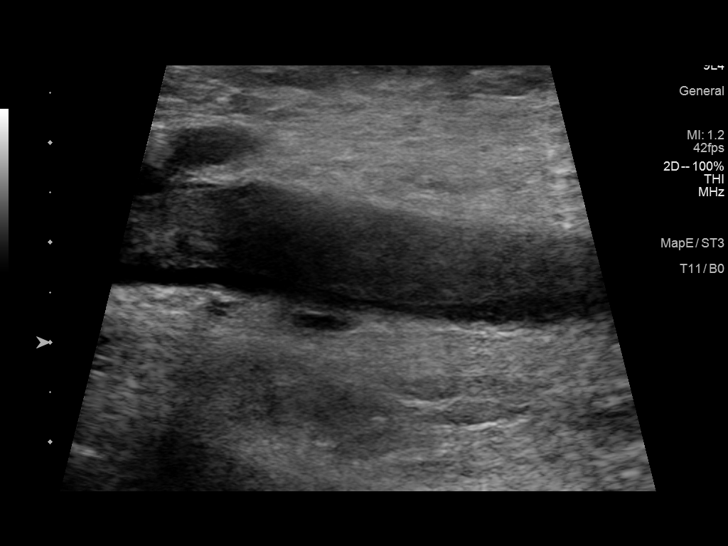
[im 20/41]
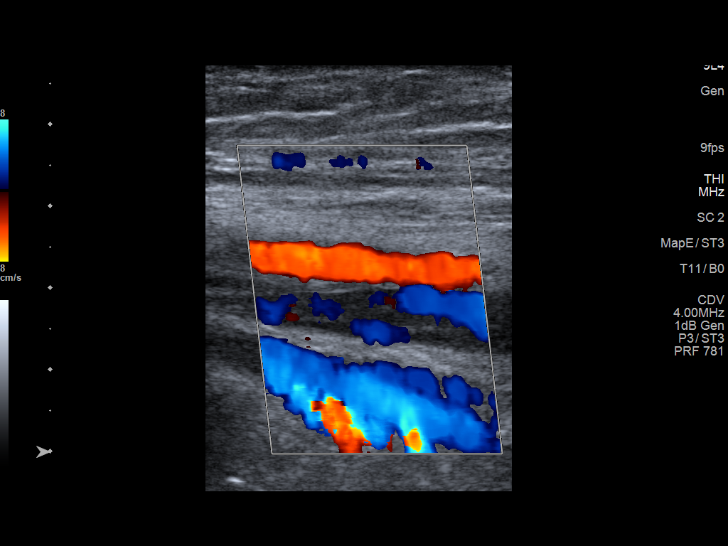
[im 21/41]
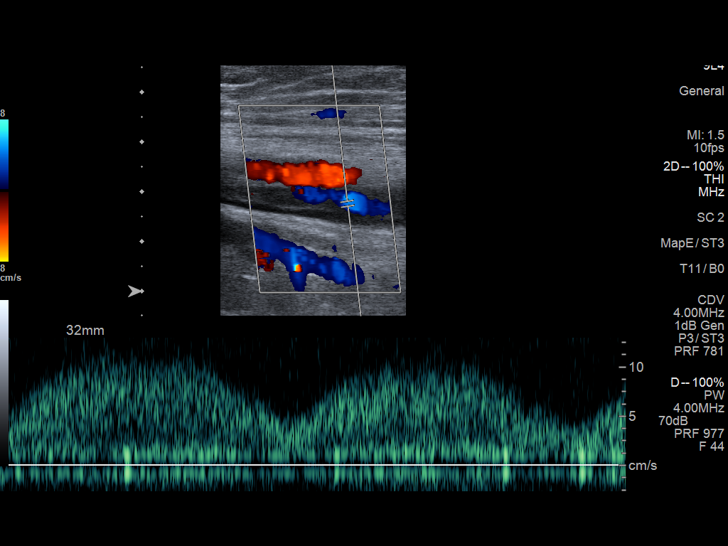
[im 25/41]
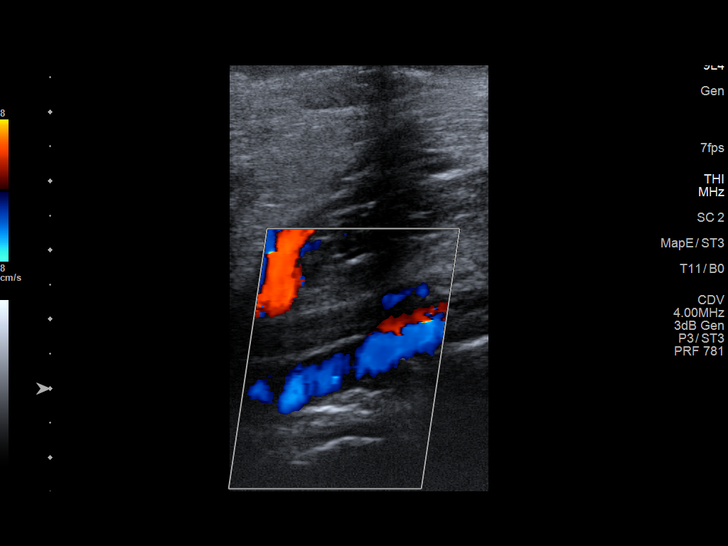
[im 28/41]
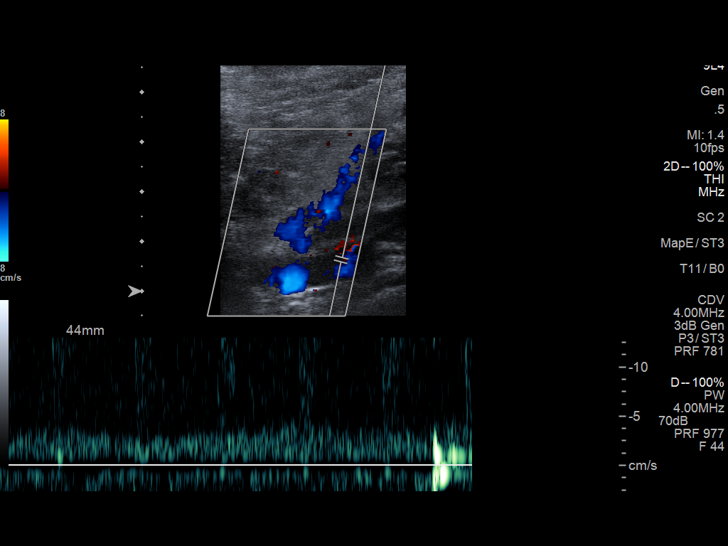
[im 32/41]
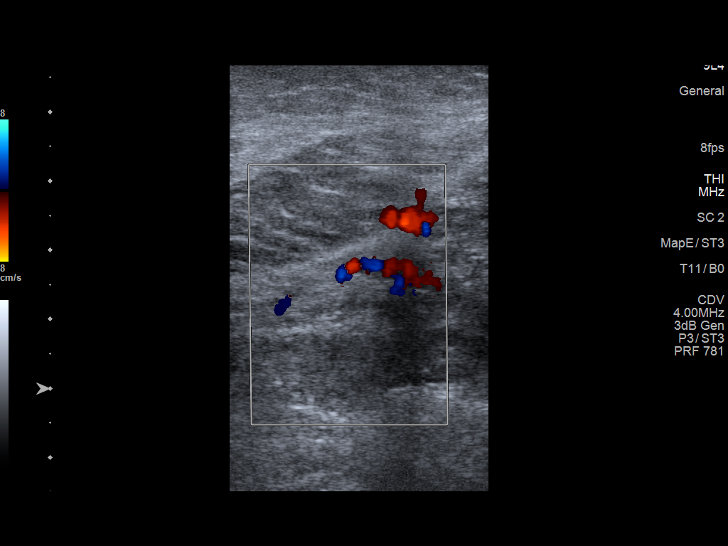
[im 34/41]
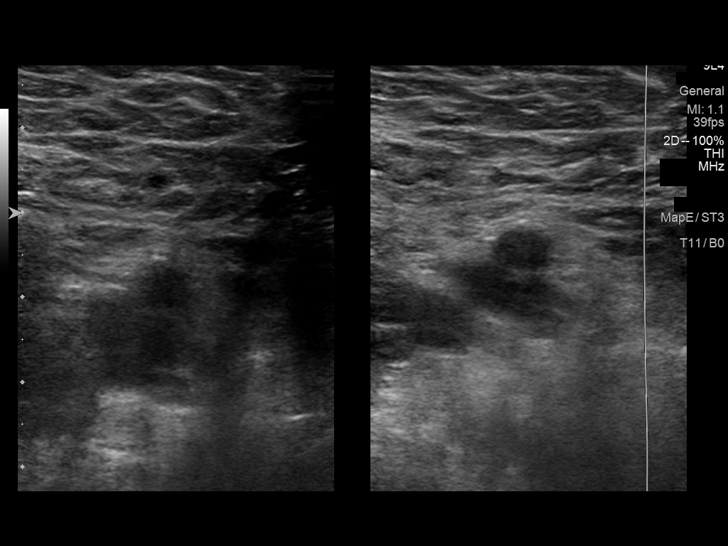
[im 37/41]
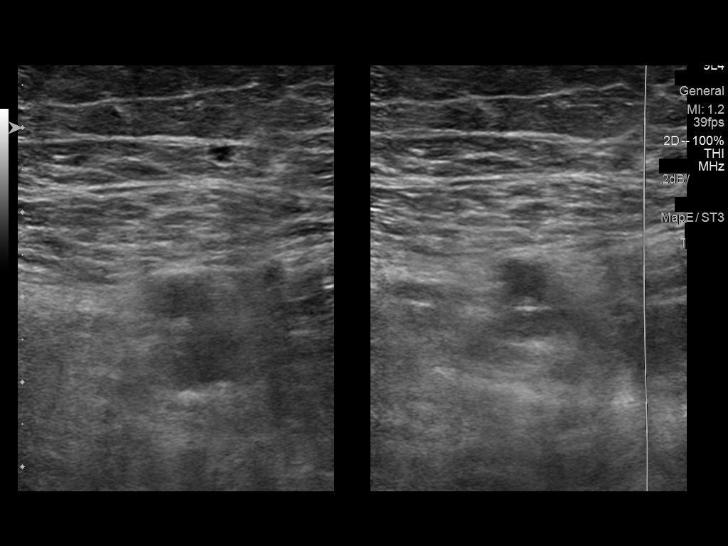
[im 41/41]
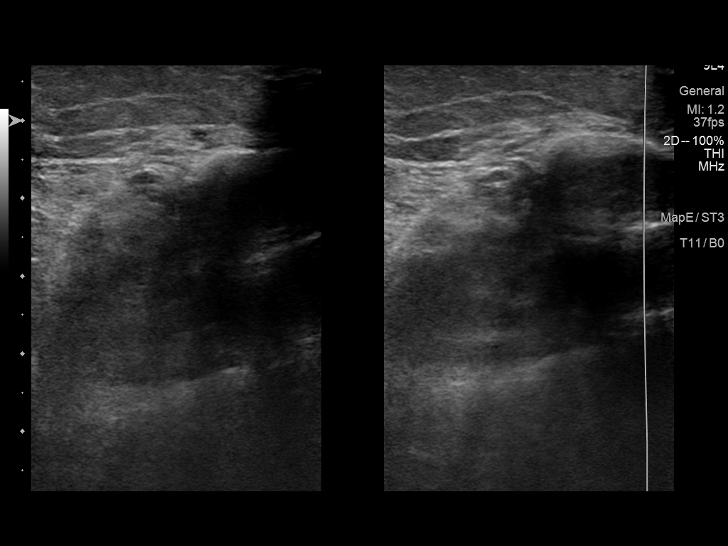

[14 of 24 positions shown; findings below may reference images not displayed]

FINDINGS: VENOUS

Partially-occluding, echogenic filling defect and noncompressibility
within the left common femoral vein, and superficial femoral vein
spanning the proximal, mid and distal segment. The thrombus extends
to the superior segment of the left popliteal vein.

Limited views of the contralateral common femoral vein are
unremarkable.

OTHER

No evidence of superficial thrombophlebitis or abnormal fluid
collection.

Limitations: none
IMPRESSION: Acute left femoropopliteal DVT, as above.

These results will be called to the ordering clinician or
representative by the Radiologist Assistant, and communication
documented in the PACS or [REDACTED].

## 2021-12-23 ENCOUNTER — Ambulatory Visit (HOSPITAL_COMMUNITY)
Admission: RE | Admit: 2021-12-23 | Discharge: 2021-12-23 | Disposition: A | Discharge status: Home / Self Care | Payer: BC Managed Care – PPO | Source: Ambulatory Visit | Attending: Cardiology | Admitting: Cardiology

## 2021-12-23 DIAGNOSIS — I82512 Chronic embolism and thrombosis of left femoral vein: Secondary | ICD-10-CM

## 2021-12-23 NOTE — Progress Notes (Addendum)
VASCULAR LAB    Left lower extremity venous duplex has been performed.  See CV proc for preliminary results.  Messaged negative results to Dr. Terri Skains via secure chat  Mauro Kaufmann, [facility], RVT 12/23/2021, 9:35 AM

## 2021-12-26 ENCOUNTER — Ambulatory Visit: Payer: BC Managed Care – PPO | Admitting: Cardiology

## 2021-12-26 ENCOUNTER — Encounter: Payer: Self-pay | Admitting: Cardiology

## 2021-12-26 VITALS — BP 144/79 | HR 73 | Temp 97.6°F | Resp 16 | Ht 65.0 in | Wt 164.6 lb

## 2021-12-26 DIAGNOSIS — Z86718 Personal history of other venous thrombosis and embolism: Secondary | ICD-10-CM

## 2021-12-26 DIAGNOSIS — I10 Essential (primary) hypertension: Secondary | ICD-10-CM

## 2021-12-26 NOTE — Progress Notes (Signed)
ID:  Kristine Kirby, DOB 1976/03/06, MRN [id number]  PCP:  Kathyrn Lass, MD  Cardiologist:  Rex Kras, DO, [facility]  (established care 02/14/2021)  Date: 12/26/21 Last Office Visit: 09/24/2021  Chief Complaint  Patient presents with   Follow-up    DVT and LE Venous Duplex    HPI  Kristine Kirby is a 46 y.o. female whose past medical history and cardiovascular risk factors include: Hypertension, Hx of left femoral-popliteal DVT, right lower lobe lesion 3 x 1.9 cm (CT PE protocol 02/2021, PET scan 8/30 2022) s/p robotic right VATS for resection  by Dr. Roxan Hockey in January 2023, aortic atherosclerosis (PET scan 02/2021).  Initially referred to the practice for evaluation of chest pain which had resolved without titration of antihypertensive medications.  Due to asymmetric lower extremity swelling she underwent lower extremity duplex which noted left femoral/popliteal DVT and was started on oral anticoagulation.  She subsequently had a CT PE protocol which was negative for pulmonary embolism but was noted to have a right-sided pleural-based well circumcised mass.  She was evaluated by medical oncology and then cardiothoracic surgery.  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  She was evaluated by hematology Dr. Lorenso Courier in the shared decision was to stop oral anticoagulation as she had completed 6 months of therapy.  She had a repeat lower extremity venous duplex prior to today's office visit which was negative for DVT.  Clinically she is doing well from a cardiovascular standpoint.  FUNCTIONAL STATUS: 3-4 walking for about 3 miles each time.    ALLERGIES: Allergies  Allergen Reactions   Codeine Nausea And  Vomiting    Severe nausea and vomiting    MEDICATION LIST PRIOR TO VISIT: Current Meds  Medication Sig   hydrochlorothiazide (HYDRODIURIL) 25 MG tablet Take 25 mg by mouth daily.   valACYclovir (VALTREX) 1000 MG tablet Take 1,000 mg by mouth 2 (two) times daily as needed (outbreaks).     PAST MEDICAL HISTORY: Past Medical History:  Diagnosis Date   Complication of anesthesia 2001   vomited during wisdom tooth extraction.   DVT of deep femoral vein, left ([facility])    Dysmenorrhea    Hypertension    Lung nodule 03/07/2021   Resected January 2023.  Benign.  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   Numbness and tingling in right hand     PAST SURGICAL HISTORY: Past Surgical History:  Procedure Laterality Date   INTERCOSTAL NERVE BLOCK Right 07/17/2021   Procedure: INTERCOSTAL NERVE BLOCK;  Surgeon: Melrose Nakayama, MD;  Location: [facility];  Service: Thoracic;  Laterality: Right;   MOUTH SURGERY     None      FAMILY HISTORY: The patient family history includes Diabetes in her mother; Hypertension in her mother.  SOCIAL HISTORY:  The patient  reports that she has never smoked. She has never used smokeless tobacco. She reports that she does not drink alcohol and does not use drugs.  REVIEW OF SYSTEMS: Review of Systems  Cardiovascular:  Negative for chest pain, claudication, dyspnea on exertion, leg swelling, near-syncope, orthopnea, palpitations, paroxysmal nocturnal dyspnea and syncope.  Respiratory:  Negative for shortness of breath.     PHYSICAL EXAM:    12/26/2021    1:54 PM 10/09/2021  1:42 PM 09/24/2021    3:42 PM  Vitals with BMI  Height '5\' 5"'$   '5\' 5"'$   Weight 164 lbs 10 oz 161 lbs 2 oz 158 lbs  BMI 27.39 43.32 95.18  Systolic 841 660 630  Diastolic 79 61 65  Pulse 73 76 70    CONSTITUTIONAL: Well-developed and well-nourished. No acute distress.  SKIN: Skin is warm and dry. No rash noted. No cyanosis. No pallor. No jaundice HEAD: Normocephalic and atraumatic.   EYES: No scleral icterus MOUTH/THROAT: Moist oral membranes.  NECK: No JVD present. No thyromegaly noted. No carotid bruits  CHEST Normal respiratory effort. No intercostal retractions  LUNGS: Clear to auscultation bilaterally.  No stridor. No wheezes. No rales.  CARDIOVASCULAR: Regular, positive S1-S2, no murmurs rubs or gallops appreciated. ABDOMINAL: Nonobese, soft, nontender, nondistended, positive bowel sounds in all 4 quadrants no apparent ascites.  EXTREMITIES: No peripheral edema.  Warm to touch bilaterally.  2+ PT pulses bilaterally.  Compression stockings present. HEMATOLOGIC: No significant bruising NEUROLOGIC: Oriented to person, place, and time. Nonfocal. Normal muscle tone.  PSYCHIATRIC: Normal mood and affect. Normal behavior. Cooperative  CARDIAC DATABASE: EKG: 12/26/2021: Normal sinus rhythm, 61 bpm, without underlying ischemia or injury pattern.  Echocardiogram: No results found for this or any previous visit from the past 1095 days.   Stress Testing: No results found for this or any previous visit from the past 1095 days.  Heart Catheterization: None  Lower extremity venous duplex: 02/20/2021: Acute left femoropopliteal DVT  US venous duplex: 06/05/2021 1. Incomplete resolution with residual LEFT femoropopliteal DVT involving the femoral and popliteal veins. 2. The LEFT common femoral vein is patent on today's evaluation, previously with thrombus.  09/05/2021: RIGHT: No evidence of common femoral vein obstruction.  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 No cystic structure found in the popliteal fossa.   12/23/2021: RIGHT:  - No evidence of common femoral vein obstruction.     LEFT:  - Findings suggest resolution of previously noted thrombus.  - There is no evidence of deep vein thrombosis in the lower extremity.  - No cystic structure found in the popliteal fossa.   CT PE  protocol: 02/19/2021: Negative for significant acute pulmonary embolus by CTA. 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 Recommend further evaluation with PET-CT.  PET scan: 03/05/2021 1. The pleural-based right-sided thoracic lesion demonstrates low-level activity, similar to the mediastinal pool. 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 If not already performed, recommend multidisciplinary thoracic oncology consultation. Consider CT follow-up at 6 months. 2.  Aortic Atherosclerosis (ICD10-I70.0).  LABORATORY DATA:    Latest Ref Rng & Units 10/09/2021    2:26 PM 07/19/2021    1:16 AM 07/18/2021    1:42 AM  CBC  WBC 4.0 - 10.5 K/uL 5.4  5.9  7.1   Hemoglobin 12.0 - 15.0 g/dL 12.0  11.1  11.6   Hematocrit 36.0 - 46.0 % 36.5  33.3  34.0   Platelets 150 - 400 K/uL 242  188  192        Latest Ref Rng & Units 10/09/2021    2:26 PM 07/19/2021    1:16 AM 07/18/2021    1:42 AM  CMP  Glucose 70 - 99 mg/dL 87  84  135   BUN 6 - 20 mg/dL '12  13  10   '$ Creatinine 0.44 -  1.00 mg/dL 0.88  0.81  0.79   Sodium 135 - 145 mmol/L 136  135  134   Potassium 3.5 - 5.1 mmol/L 3.8  4.1  3.5   Chloride 98 - 111 mmol/L 102  109  103   CO2 22 - 32 mmol/L '28  22  21   '$ Calcium 8.9 - 10.3 mg/dL 9.1  7.9  8.3   Total Protein 6.5 - 8.1 g/dL 7.7  5.5    Total Bilirubin 0.3 - 1.2 mg/dL 0.4  1.5    Alkaline Phos 38 - 126 U/L 55  36    AST 15 - 41 U/L 27  26    ALT 0 - 44 U/L 44  23      Lipid Panel  No results found for: "CHOL", "TRIG", "HDL", "CHOLHDL", "VLDL", "LDLCALC", "LDLDIRECT", "LABVLDL"  No components found for: "NTPROBNP" No results for input(s): "PROBNP" in the last 8760 hours. No results for input(s): "TSH" in the last 8760 hours.  BMP Recent Labs    07/18/21 0142 07/19/21 0116 10/09/21 1426  NA 134* 135 136  K 3.5 4.1 3.8  CL 103 109 102  CO2 21* 22  28  GLUCOSE 135* 84 87  BUN '10 13 12  '$ CREATININE 0.79 0.81 0.88  CALCIUM 8.3* 7.9* 9.1  GFRNONAA >60 >60 >60    HEMOGLOBIN A1C No results found for: "HGBA1C", "MPG"  External Labs: Collected: 01/17/2021 provided by PCP Hemoglobin 11 g/dL, hematocrit 38.8%. Sodium 137, potassium 4, chloride 101, bicarb 27 BUN 15, creatinine 0.73 mg/dL. AST 12, ALT 12, alkaline phosphatase 45  IMPRESSION:    ICD-10-CM   1. Hx of deep venous thrombosis  Z86.718     2. Long term (current) use of anticoagulants  Z79.01     3. Benign hypertension  I10 EKG 12-Lead    PCV ECHOCARDIOGRAM COMPLETE    PCV CARDIAC STRESS TEST       RECOMMENDATIONS: Kristine Kirby is a 46 y.o. female whose past medical history and cardiac risk factors include: Hypertension, Hx of left femoral-popliteal DVT, right lower lobe lesion 3 x 1.9 cm (CT PE protocol 02/2021, PET scan 8/30 2022) s/p robotic right VATS for resection  by Dr. Roxan Hockey in January 2023, aortic atherosclerosis (PET scan 02/2021).  Hx of left femoral popliteal DVT: Diagnosed with acute left femoral-popliteal DVT February 20, 2021. Follow-up with venous duplex in November 2022 notes residual left femoral-popliteal DVT. Follow-up venous duplex March 2022 notes residual left mid femoral vein DVT. Recent LE Venous Duplex 12/2021 negative for DVT.  Stopped [place] in April 2023.   Benign essential hypertension: Home blood pressures are better controlled. Office blood pressure today is not well controlled, likely skewed.   Patient is compliant with her oral medications. I have asked her to keep a log of her blood pressures and to review it with PCP to see if medication changes are warranted. We emphasized the importance of low-salt diet.   Echo will be ordered to evaluate for structural heart disease and left ventricular systolic function.    Patient was originally referred to the practice for evaluation of precordial pain but due to the findings of lower  extremity swelling and abnormal CT scan she never underwent an echo or GXT.  We will proceed with both prior to the next office visit.  She has an appointment to see her primary care in September 2023 arrange a follow-up after.  FINAL MEDICATION LIST END OF ENCOUNTER: No orders of the defined types  were placed in this encounter.   Medications Discontinued During This Encounter  Medication Reason   ELIQUIS 5 MG TABS tablet    amoxicillin-clavulanate (AUGMENTIN) 875-125 MG tablet     Current Outpatient Medications:    hydrochlorothiazide (HYDRODIURIL) 25 MG tablet, Take 25 mg by mouth daily., Disp: , Rfl:    valACYclovir (VALTREX) 1000 MG tablet, Take 1,000 mg by mouth 2 (two) times daily as needed (outbreaks)., Disp: , Rfl:   Orders Placed This Encounter  Procedures   PCV CARDIAC STRESS TEST   EKG 12-Lead   PCV ECHOCARDIOGRAM COMPLETE   There are no Patient Instructions on file for this visit.   --Continue cardiac medications as reconciled in final medication list. --Return in about 3 months (around 03/14/2022) for Follow up, Review test results. Or sooner if needed. --Continue follow-up with your primary care physician regarding the management of your other chronic comorbid conditions.  Patient's questions and concerns were addressed to her satisfaction. She voices understanding of the instructions provided during this encounter.   This note was created using a voice recognition software as a result there may be grammatical errors inadvertently enclosed that do not reflect the nature of this encounter. Every attempt is made to correct such errors.  Rex Kras, [place], [facility]  Pager: [phone] Office: [phone]

## 2022-01-17 ENCOUNTER — Ambulatory Visit: Payer: BC Managed Care – PPO

## 2022-01-17 DIAGNOSIS — I10 Essential (primary) hypertension: Secondary | ICD-10-CM

## 2022-01-31 ENCOUNTER — Ambulatory Visit: Payer: BC Managed Care – PPO

## 2022-01-31 DIAGNOSIS — I10 Essential (primary) hypertension: Secondary | ICD-10-CM

## 2022-03-27 ENCOUNTER — Encounter: Payer: Self-pay | Admitting: Cardiology

## 2022-03-27 ENCOUNTER — Ambulatory Visit: Payer: BC Managed Care – PPO | Admitting: Cardiology

## 2022-03-27 VITALS — BP 140/81 | HR 63 | Temp 98.0°F | Resp 16 | Ht 65.0 in | Wt 162.0 lb

## 2022-03-27 DIAGNOSIS — Z712 Person consulting for explanation of examination or test findings: Secondary | ICD-10-CM

## 2022-03-27 DIAGNOSIS — Z86718 Personal history of other venous thrombosis and embolism: Secondary | ICD-10-CM

## 2022-03-27 DIAGNOSIS — I10 Essential (primary) hypertension: Secondary | ICD-10-CM

## 2022-03-27 NOTE — Progress Notes (Signed)
ID:  Kristine Kirby, DOB [birth year], MRN [id number]  PCP:  Kathyrn Lass, MD  Cardiologist:  Rex Kras, DO, [facility]  (established care 02/14/2021)  Date: 03/27/22 Last Office Visit: 12/26/2021  Chief Complaint  Patient presents with   Follow-up    3 month    HPI  Kristine Kirby is a 46 y.o. female whose past medical history and cardiovascular risk factors include: Hypertension, Hx of left femoral-popliteal DVT, right lower lobe lesion 3 x 1.9 cm (CT PE protocol 02/2021, PET scan 8/30 2022) s/p robotic right VATS for resection  by Dr. Roxan Hockey in January 2023, aortic atherosclerosis (PET scan 02/2021).  Initially referred to the practice for evaluation of chest pain.  However on physical examination she was noted to have asymmetrical lower extremity swelling and underwent duplex and was noted to have left femoral/popliteal DVT started on oral anticoagulation.  She underwent CT PE protocol which was negative for pulm embolism was noted to have right-sided pleural-based well circumcised mass.  She was evaluated by medical oncology and then cardiothoracic surgery.  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  Patient had completed the course of oral anticoagulation and also followed up with Dr. Lorenso Courier from hematology at the last office visit the shared decision was to proceed with echo and stress test which was the original plan given the fact that she presented with chest pain.  She now presents for follow-up.  Patient denies any anginal discomfort or heart failure symptoms.  Her overall physical activity remains relatively stable.   ALLERGIES: Allergies  Allergen Reactions   Codeine Nausea And Vomiting    Severe nausea and vomiting    MEDICATION LIST PRIOR TO VISIT: No outpatient medications have been marked as taking for the 03/27/22 encounter (Office Visit) with Rex Kras, DO.     PAST MEDICAL  HISTORY: Past Medical History:  Diagnosis Date   Complication of anesthesia 2001   vomited during wisdom tooth extraction.   DVT of deep femoral vein, left (HCC)    Dysmenorrhea    Hypertension    Lung nodule 03/07/2021   Resected January 2023.  Benign.  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   Numbness and tingling in right hand     PAST SURGICAL HISTORY: Past Surgical History:  Procedure Laterality Date   INTERCOSTAL NERVE BLOCK Right 07/17/2021   Procedure: INTERCOSTAL NERVE BLOCK;  Surgeon: Melrose Nakayama, MD;  Location: [place];  Service: Thoracic;  Laterality: Right;   MOUTH SURGERY     None      FAMILY HISTORY: The patient family history includes Diabetes in her mother; Hypertension in her mother.  SOCIAL HISTORY:  The patient  reports that she has never smoked. She has never used smokeless tobacco. She reports that she does not drink alcohol and does not use drugs.  REVIEW OF SYSTEMS: Review of Systems  Cardiovascular:  Negative for chest pain, claudication, dyspnea on exertion, leg swelling, near-syncope, orthopnea, palpitations, paroxysmal nocturnal dyspnea and syncope.  Respiratory:  Negative for shortness of breath.     PHYSICAL EXAM:    03/27/2022    3:21 PM 12/26/2021    1:54 PM 10/09/2021    1:42 PM  Vitals with BMI  Height '5\' 5"'$  '5\' 5"'$    Weight 162 lbs 164 lbs 10 oz 161 lbs 2 oz  BMI 26.96 40.98 11.91  Systolic 478 295 621  Diastolic 81 79 61  Pulse 63 73 76  CONSTITUTIONAL: Well-developed and well-nourished. No acute distress.  SKIN: Skin is warm and dry. No rash noted. No cyanosis. No pallor. No jaundice HEAD: Normocephalic and atraumatic.  EYES: No scleral icterus MOUTH/THROAT: Moist oral membranes.  NECK: No JVD present. No thyromegaly noted. No carotid bruits  CHEST Normal respiratory effort. No intercostal retractions  LUNGS: Clear to auscultation bilaterally.  No stridor. No wheezes. No rales.  CARDIOVASCULAR: Regular, positive S1-S2, no  murmurs rubs or gallops appreciated. ABDOMINAL: Nonobese, soft, nontender, nondistended, positive bowel sounds in all 4 quadrants no apparent ascites.  EXTREMITIES: No peripheral edema.  Warm to touch bilaterally.  2+ PT pulses bilaterally. HEMATOLOGIC: No significant bruising NEUROLOGIC: Oriented to person, place, and time. Nonfocal. Normal muscle tone.  PSYCHIATRIC: Normal mood and affect. Normal behavior. Cooperative  CARDIAC DATABASE: EKG: 12/26/2021: Normal sinus rhythm, 61 bpm, without underlying ischemia or injury pattern.  Echocardiogram: 01/16/2022: Normal LV systolic function with EF 56%. Left ventricle cavity is normal in size. Normal left ventricular wall thickness. Normal global wall motion. Normal diastolic filling pattern. Calculated EF 56%. Left atrial cavity is normal in size. 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 IVC is dilated with respiratory variation. May suggest mild elevation in CVP.   Stress Testing: Exercise treadmill stress test 01/31/2022: Functional status: Average Chest pain: None. Reason for stopping exercise: Fatigue/weakness. Hypertensive response to exercise: No. Exercise time 9 minutes 23 seconds on Bruce protocol, achieved 11.18 METS, 92% of age-predicted maximum heart rate (APMHR). Stress ECG negative for ischemia. Low risk study.  Heart Catheterization: None  Lower extremity venous duplex: 02/20/2021: Acute left femoropopliteal DVT  US venous duplex: 06/05/2021 1. Incomplete resolution with residual LEFT femoropopliteal DVT involving the femoral and popliteal veins. 2. The LEFT common femoral vein is patent on today's evaluation, previously with thrombus.  09/05/2021: RIGHT: No evidence of common femoral vein obstruction.  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 No cystic structure found in the popliteal fossa.   12/23/2021: RIGHT:  - No evidence of  common femoral vein obstruction.     LEFT:  - Findings suggest resolution of previously noted thrombus.  - There is no evidence of deep vein thrombosis in the lower extremity.  - No cystic structure found in the popliteal fossa.   CT PE protocol: 02/19/2021: Negative for significant acute pulmonary embolus by CTA. 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 Recommend further evaluation with PET-CT.  PET scan: 03/05/2021 1. The pleural-based right-sided thoracic lesion demonstrates low-level activity, similar to the mediastinal pool. 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 If not already performed, recommend multidisciplinary thoracic oncology consultation. Consider CT follow-up at 6 months. 2.  Aortic Atherosclerosis (ICD10-I70.0).  LABORATORY DATA:    Latest Ref Rng & Units 10/09/2021    2:26 PM 07/19/2021    1:16 AM 07/18/2021    1:42 AM  CBC  WBC 4.0 - 10.5 K/uL 5.4  5.9  7.1   Hemoglobin 12.0 - 15.0 g/dL 12.0  11.1  11.6   Hematocrit 36.0 - 46.0 % 36.5  33.3  34.0   Platelets 150 - 400 K/uL 242  188  192        Latest Ref Rng & Units 10/09/2021    2:26 PM 07/19/2021    1:16 AM 07/18/2021    1:42 AM  CMP  Glucose 70 -  99 mg/dL 87  84  135   BUN 6 - 20 mg/dL '12  13  10   '$ Creatinine 0.44 - 1.00 mg/dL 0.88  0.81  0.79   Sodium 135 - 145 mmol/L 136  135  134   Potassium 3.5 - 5.1 mmol/L 3.8  4.1  3.5   Chloride 98 - 111 mmol/L 102  109  103   CO2 22 - 32 mmol/L '28  22  21   '$ Calcium 8.9 - 10.3 mg/dL 9.1  7.9  8.3   Total Protein 6.5 - 8.1 g/dL 7.7  5.5    Total Bilirubin 0.3 - 1.2 mg/dL 0.4  1.5    Alkaline Phos 38 - 126 U/L 55  36    AST 15 - 41 U/L 27  26    ALT 0 - 44 U/L 44  23      Lipid Panel  No results found for: "CHOL", "TRIG", "HDL", "CHOLHDL", "VLDL", "LDLCALC", "LDLDIRECT", "LABVLDL"  No components found for: "NTPROBNP" No  results for input(s): "PROBNP" in the last 8760 hours. No results for input(s): "TSH" in the last 8760 hours.  BMP Recent Labs    07/18/21 0142 07/19/21 0116 10/09/21 1426  NA 134* 135 136  K 3.5 4.1 3.8  CL 103 109 102  CO2 21* 22 28  GLUCOSE 135* 84 87  BUN '10 13 12  '$ CREATININE 0.79 0.81 0.88  CALCIUM 8.3* 7.9* 9.1  GFRNONAA >60 >60 >60    HEMOGLOBIN A1C No results found for: "HGBA1C", "MPG"  External Labs: Collected: 01/17/2021 provided by PCP Hemoglobin 11 g/dL, hematocrit 38.8%. Sodium 137, potassium 4, chloride 101, bicarb 27 BUN 15, creatinine 0.73 mg/dL. AST 12, ALT 12, alkaline phosphatase 45  IMPRESSION:    ICD-10-CM   1. Benign hypertension  I10     2. Hx of deep venous thrombosis  Z86.718     3. Encounter to discuss test results  Z71.2        RECOMMENDATIONS: Kristine Kirby is a 46 y.o. female whose past medical history and cardiac risk factors include: Hypertension, Hx of left femoral-popliteal DVT, right lower lobe lesion 3 x 1.9 cm (CT PE protocol 02/2021, PET scan 8/30 2022) s/p robotic right VATS for resection  by Dr. Roxan Hockey in January 2023, aortic atherosclerosis (PET scan 02/2021).  Since last office visit patient had an echocardiogram and GXT.  Echo illustrated preserved LVEF and no significant valvular heart disease.  And GXT overall low risk study.  Patient's blood pressures are within acceptable limits but could be better.  Home blood pressure log reviewed and systolic blood pressures are between 120-140 mmHg.  She will work with PCP with regards to up titration of antihypertensive medications.  No additional cardiovascular recommendations warranted at this time.  I will see the patient on as-needed basis.  FINAL MEDICATION LIST END OF ENCOUNTER: No orders of the defined types were placed in this encounter.   There are no discontinued medications.   Current Outpatient Medications:    hydrochlorothiazide (HYDRODIURIL) 25 MG tablet,  Take 25 mg by mouth daily., Disp: , Rfl:    valACYclovir (VALTREX) 1000 MG tablet, Take 1,000 mg by mouth 2 (two) times daily as needed (outbreaks)., Disp: , Rfl:   No orders of the defined types were placed in this encounter.  There are no Patient Instructions on file for this visit.   --Continue cardiac medications as reconciled in final medication list. --Return if symptoms worsen or fail to improve. Or  sooner if needed. --Continue follow-up with your primary care physician regarding the management of your other chronic comorbid conditions.  Patient's questions and concerns were addressed to her satisfaction. She voices understanding of the instructions provided during this encounter.   This note was created using a voice recognition software as a result there may be grammatical errors inadvertently enclosed that do not reflect the nature of this encounter. Every attempt is made to correct such errors.  Rex Kras, [place], [facility]  Pager: [phone] Office: [phone]

## 2022-04-25 ENCOUNTER — Encounter: Payer: Self-pay | Admitting: *Deleted

## 2022-04-25 ENCOUNTER — Inpatient Hospital Stay: Payer: BC Managed Care – PPO | Attending: Hematology and Oncology

## 2022-04-25 ENCOUNTER — Other Ambulatory Visit: Payer: Self-pay | Admitting: Hematology and Oncology

## 2022-04-25 ENCOUNTER — Inpatient Hospital Stay (HOSPITAL_BASED_OUTPATIENT_CLINIC_OR_DEPARTMENT_OTHER): Payer: BC Managed Care – PPO | Admitting: Hematology and Oncology

## 2022-04-25 VITALS — BP 122/69 | HR 63 | Temp 97.8°F | Resp 14 | Wt 160.3 lb

## 2022-04-25 DIAGNOSIS — Z86718 Personal history of other venous thrombosis and embolism: Secondary | ICD-10-CM | POA: Insufficient documentation

## 2022-04-25 DIAGNOSIS — I82532 Chronic embolism and thrombosis of left popliteal vein: Secondary | ICD-10-CM

## 2022-04-25 DIAGNOSIS — I10 Essential (primary) hypertension: Secondary | ICD-10-CM | POA: Insufficient documentation

## 2022-04-25 DIAGNOSIS — Z79899 Other long term (current) drug therapy: Secondary | ICD-10-CM | POA: Insufficient documentation

## 2022-04-25 LAB — CBC WITH DIFFERENTIAL (CANCER CENTER ONLY)
Abs Immature Granulocytes: 0.01 10*3/uL (ref 0.00–0.07)
Basophils Absolute: 0 10*3/uL (ref 0.0–0.1)
Basophils Relative: 0 %
Eosinophils Absolute: 0 10*3/uL (ref 0.0–0.5)
Eosinophils Relative: 0 %
HCT: 35.8 % — ABNORMAL LOW (ref 36.0–46.0)
Hemoglobin: 12.1 g/dL (ref 12.0–15.0)
Immature Granulocytes: 0 %
Lymphocytes Relative: 44 %
Lymphs Abs: 1.5 10*3/uL (ref 0.7–4.0)
MCH: 30.4 pg (ref 26.0–34.0)
MCHC: 33.8 g/dL (ref 30.0–36.0)
MCV: 89.9 fL (ref 80.0–100.0)
Monocytes Absolute: 0.3 10*3/uL (ref 0.1–1.0)
Monocytes Relative: 10 %
Neutro Abs: 1.6 10*3/uL — ABNORMAL LOW (ref 1.7–7.7)
Neutrophils Relative %: 46 %
Platelet Count: 210 10*3/uL (ref 150–400)
RBC: 3.98 MIL/uL (ref 3.87–5.11)
RDW: 13.3 % (ref 11.5–15.5)
WBC Count: 3.5 10*3/uL — ABNORMAL LOW (ref 4.0–10.5)
nRBC: 0 % (ref 0.0–0.2)

## 2022-04-25 LAB — CMP (CANCER CENTER ONLY)
ALT: 13 U/L (ref 0–44)
AST: 13 U/L — ABNORMAL LOW (ref 15–41)
Albumin: 3.8 g/dL (ref 3.5–5.0)
Alkaline Phosphatase: 48 U/L (ref 38–126)
Anion gap: 4 — ABNORMAL LOW (ref 5–15)
BUN: 21 mg/dL — ABNORMAL HIGH (ref 6–20)
CO2: 30 mmol/L (ref 22–32)
Calcium: 9.1 mg/dL (ref 8.9–10.3)
Chloride: 104 mmol/L (ref 98–111)
Creatinine: 0.92 mg/dL (ref 0.44–1.00)
GFR, Estimated: >60 mL/min (ref >60)
Glucose, Bld: 90 mg/dL (ref 70–99)
Potassium: 4 mmol/L (ref 3.5–5.1)
Sodium: 138 mmol/L (ref 135–145)
Total Bilirubin: 0.9 mg/dL (ref 0.3–1.2)
Total Protein: 6.8 g/dL (ref 6.5–8.1)

## 2022-04-25 NOTE — Progress Notes (Signed)
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  10/09/2021: establish care with Dr. Lorenso Courier   Interval History:  Kristine Kirby 46 y.o. female with medical history significant for LLE DVT presents for a follow up visit. The patient's last visit was on 10/09/2021. In the interim since the last visit she had a repeat ultrasound performed in June which showed the DVT had resolved.  On exam Kristine Kirby reports she has not had no further pain or swelling in her leg.  She is wearing compression socks.  Overall she feels quite well.  She did stop taking her blood thinner when it was found that the DVT had resolved.  She is not currently taking any birth control but finds that her cycles are not too heavy.  She notes that she does occasionally have 2 cycles back-to-back but feels like she is getting "close to menopause".  She notes that her energy levels are currently good.  She denies any fevers, chills, sweats, nausea, ming or diarrhea.  A full 10 point ROS was otherwise negative.  MEDICAL HISTORY:  Past Medical History:  Diagnosis Date   Complication of anesthesia 2001   vomited during wisdom tooth extraction.   DVT of deep femoral vein, left (HCC)    Dysmenorrhea    Hypertension    Lung nodule 03/07/2021   Resected January 2023.  Benign.  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   Numbness and tingling in right hand     SURGICAL HISTORY: Past Surgical History:  Procedure Laterality Date   INTERCOSTAL NERVE BLOCK Right 07/17/2021   Procedure:  INTERCOSTAL NERVE BLOCK;  Surgeon: Melrose Nakayama, MD;  Location: [place];  Service: Thoracic;  Laterality: Right;   MOUTH SURGERY     None      SOCIAL HISTORY: Social History   Socioeconomic History   Marital status: Single    Spouse name: Not on file   Number of children: 0   Years of education: College   Highest education level: Not on file  Occupational History    Comment: School Teacher  Tobacco Use   Smoking status: Never   Smokeless tobacco: Never  Vaping Use   Vaping Use: Never used  Substance and Sexual Activity   Alcohol use: No    Comment: occasionally   Drug use: No   Sexual activity: Not on file  Other Topics Concern   Not on file  Social History Narrative   Patient is a Education officer, museum full time.   EducationNurse, mental health.   Right handed.            Social Determinants of Health   Financial Resource Strain: Not on file  Food Insecurity: Not on file  Transportation Needs: Not on file  Physical Activity: Not on file  Stress: Not on file  Social Connections: Not on file  Intimate Partner Violence: Not on file    FAMILY HISTORY: Family History  Problem Relation Age of Onset   Hypertension Mother    Diabetes Mother     ALLERGIES:  is allergic to codeine.  MEDICATIONS:  Current Outpatient Medications  Medication  Sig Dispense Refill   hydrochlorothiazide (HYDRODIURIL) 25 MG tablet Take 25 mg by mouth daily.     valACYclovir (VALTREX) 1000 MG tablet Take 1,000 mg by mouth 2 (two) times daily as needed (outbreaks).     No current facility-administered medications for this visit.    REVIEW OF SYSTEMS:   Constitutional: ( - ) fevers, ( - )  chills , ( - ) night sweats Eyes: ( - ) blurriness of vision, ( - ) double vision, ( - ) watery eyes Ears, nose, mouth, throat, and face: ( - ) mucositis, ( - ) sore throat Respiratory: ( - ) cough, ( - ) dyspnea, ( - ) wheezes Cardiovascular: ( - ) palpitation, ( - ) chest discomfort, ( - ) lower extremity  swelling Gastrointestinal:  ( - ) nausea, ( - ) heartburn, ( - ) change in bowel habits Skin: ( - ) abnormal skin rashes Lymphatics: ( - ) new lymphadenopathy, ( - ) easy bruising Neurological: ( - ) numbness, ( - ) tingling, ( - ) new weaknesses Behavioral/Psych: ( - ) mood change, ( - ) new changes  All other systems were reviewed with the patient and are negative.  PHYSICAL EXAMINATION:  Vitals:   04/25/22 1051  BP: 122/69  Pulse: 63  Resp: 14  Temp: 97.8 F (36.6 C)  SpO2: 100%   Filed Weights   04/25/22 1051  Weight: 160 lb 4.8 oz (72.7 kg)    GENERAL: Well-appearing middle-aged African-American female, alert, no distress and comfortable SKIN: skin color, texture, turgor are normal, no rashes or significant lesions EYES: conjunctiva are pink and non-injected, sclera clear LUNGS: clear to auscultation and percussion with normal breathing effort HEART: regular rate & rhythm and no murmurs and no lower extremity edema Musculoskeletal: no cyanosis of digits and no clubbing  PSYCH: alert & oriented x 3, fluent speech NEURO: no focal motor/sensory deficits  LABORATORY DATA:  I have reviewed the data as listed    Latest Ref Rng & Units 04/25/2022   10:37 AM 10/09/2021    2:26 PM 07/19/2021    1:16 AM  CBC  WBC 4.0 - 10.5 K/uL 3.5  5.4  5.9   Hemoglobin 12.0 - 15.0 g/dL 12.1  12.0  11.1   Hematocrit 36.0 - 46.0 % 35.8  36.5  33.3   Platelets 150 - 400 K/uL 210  242  188        Latest Ref Rng & Units 04/25/2022   10:37 AM 10/09/2021    2:26 PM 07/19/2021    1:16 AM  CMP  Glucose 70 - 99 mg/dL 90  87  84   BUN 6 - 20 mg/dL '21  12  13   '$ Creatinine 0.44 - 1.00 mg/dL 0.92  0.88  0.81   Sodium 135 - 145 mmol/L 138  136  135   Potassium 3.5 - 5.1 mmol/L 4.0  3.8  4.1   Chloride 98 - 111 mmol/L 104  102  109   CO2 22 - 32 mmol/L '30  28  22   '$ Calcium 8.9 - 10.3 mg/dL 9.1  9.1  7.9   Total Protein 6.5 - 8.1 g/dL 6.8  7.7  5.5   Total Bilirubin 0.3 - 1.2 mg/dL 0.9  0.4  1.5    Alkaline Phos 38 - 126 U/L 48  55  36   AST 15 - 41 U/L '13  27  26   '$ ALT 0 - 44 U/L 13  44  23     Lab Results  Component Value Date   MPROTEIN Not Observed 08/02/2013   No results found for: "KPAFRELGTCHN", "LAMBDASER", "KAPLAMBRATIO"  RADIOGRAPHIC STUDIES: No results found.  ASSESSMENT & PLAN Kristine Kirby 46 y.o. female with medical history significant for LLE DVT presents for a follow up visit.  After review of the labs, review of the records, and discussion with the patient the patients findings are most consistent with a provoked left lower extremity DVT which has turned into a chronic left lower extremity clot.   A provoked venous thromboembolism (VTE) is one that has a clear inciting factor or event. Provoking factors include prolonged travel/immobility, surgery (particularly abdominal or orthopedic), trauma,  and pregnancy/ estrogen containing birth control. This patient was reported to have been taking estrogen-based birth control, which would qualify as a transient provoking factor. As such we would recommend 3-6 months of anticoagulation therapy with consideration of additional therapy if symptoms persist. The anticoagulation therapy of choice in this situation is Eliquis. The patient has a supply of this medication and can afford it without difficulty. We will plan to see the patient back    #Provoked DVT --findings at this time are consistent with a provoked VTE  --will order baseline CMP and CBC to assure labs are adequate for DOAC therapy  -- Agree with discontinuation of her anticoagulation as her DVT was provoked and has subsequently resolved. --patient denies any bleeding, bruising, or dark stools on this medication. It is well tolerated. No difficulties accessing/affording the medication  --Labs today show white blood cell count 3.5, hemoglobin 12.1, MCV 89.9, and platelets of 210. --RTC if any signs/symptoms concerning for recurrent DVT.   No orders of the  defined types were placed in this encounter.   All questions were answered. The patient knows to call the clinic with any problems, questions or concerns.  A total of more than 25 minutes were spent on this encounter with face-to-face time and non-face-to-face time, including preparing to see the patient, ordering tests and/or medications, counseling the patient and coordination of care as outlined above.   Ledell Peoples, MD Department of Hematology/Oncology [place] [facility] Phone: [phone] Pager: [phone] Email: [email or web address]  05/04/2022 2:48 PM

## 2022-09-24 ENCOUNTER — Other Ambulatory Visit: Payer: Self-pay | Admitting: Family Medicine

## 2022-09-24 DIAGNOSIS — N923 Ovulation bleeding: Secondary | ICD-10-CM

## 2022-10-20 ENCOUNTER — Encounter: Payer: Self-pay | Admitting: Family Medicine

## 2022-11-10 ENCOUNTER — Other Ambulatory Visit: Payer: BC Managed Care – PPO

## 2022-12-29 ENCOUNTER — Ambulatory Visit
Admission: RE | Admit: 2022-12-29 | Discharge: 2022-12-29 | Disposition: A | Discharge status: Home / Self Care | Payer: BC Managed Care – PPO | Source: Ambulatory Visit | Attending: Family Medicine | Admitting: Family Medicine

## 2022-12-29 DIAGNOSIS — N923 Ovulation bleeding: Secondary | ICD-10-CM

## 2023-03-03 ENCOUNTER — Other Ambulatory Visit: Payer: Self-pay | Admitting: Medical Genetics

## 2023-03-03 DIAGNOSIS — Z006 Encounter for examination for normal comparison and control in clinical research program: Secondary | ICD-10-CM

## 2024-04-25 ENCOUNTER — Other Ambulatory Visit: Payer: Self-pay | Admitting: Medical Genetics

## 2024-04-25 DIAGNOSIS — Z006 Encounter for examination for normal comparison and control in clinical research program: Secondary | ICD-10-CM

## 2024-06-08 LAB — GENECONNECT MOLECULAR SCREEN: Genetic Analysis Overall Interpretation: NEGATIVE
